# Patient Record
Sex: Male | Born: 1955 | Race: White | Hispanic: No | Marital: Married | Smoking: Never smoker
Health system: Southern US, Community
[De-identification: ages and names within clinical notes are randomized; demographics above are authoritative.]

## PROBLEM LIST (undated history)

## (undated) DIAGNOSIS — M7918 Myalgia, other site: Secondary | ICD-10-CM

## (undated) DIAGNOSIS — M549 Dorsalgia, unspecified: Secondary | ICD-10-CM

## (undated) DIAGNOSIS — M79603 Pain in arm, unspecified: Secondary | ICD-10-CM

## (undated) DIAGNOSIS — M1990 Unspecified osteoarthritis, unspecified site: Secondary | ICD-10-CM

## (undated) DIAGNOSIS — M5126 Other intervertebral disc displacement, lumbar region: Secondary | ICD-10-CM

## (undated) HISTORY — PX: KNEE ARTHROSCOPY: SUR90

## (undated) HISTORY — PX: WISDOM TOOTH EXTRACTION: SHX21

## (undated) HISTORY — DX: Other intervertebral disc displacement, lumbar region: M51.26

## (undated) HISTORY — PX: COLONOSCOPY: SHX5424

## (undated) HISTORY — DX: Pain in arm, unspecified: M79.603

## (undated) HISTORY — PX: OTHER SURGICAL HISTORY: SHX169

## (undated) HISTORY — DX: Myalgia, other site: M79.18

## (undated) HISTORY — DX: Dorsalgia, unspecified: M54.9

## (undated) HISTORY — PX: STRABISMUS SURGERY: SHX218

---

## 2018-01-26 ENCOUNTER — Other Ambulatory Visit: Payer: Self-pay

## 2018-01-26 ENCOUNTER — Encounter (HOSPITAL_COMMUNITY): Payer: Self-pay | Admitting: Emergency Medicine

## 2018-01-26 ENCOUNTER — Ambulatory Visit (HOSPITAL_COMMUNITY)
Admission: EM | Admit: 2018-01-26 | Discharge: 2018-01-26 | Disposition: A | Discharge status: Home / Self Care | Payer: Self-pay | Attending: Family Medicine | Admitting: Family Medicine

## 2018-01-26 DIAGNOSIS — L500 Allergic urticaria: Secondary | ICD-10-CM

## 2018-01-26 MED ORDER — METHYLPREDNISOLONE ACETATE 80 MG/ML IJ SUSP
80.0000 mg | Freq: Once | INTRAMUSCULAR | Status: AC
  Administered 2018-01-26: 80 mg via INTRAMUSCULAR

## 2018-01-26 MED ORDER — AMPHETAMINE-DEXTROAMPHETAMINE 20 MG PO TABS: 20.0000 mg | ORAL_TABLET | Freq: Every day | ORAL | 0 refills | Status: AC

## 2018-01-26 MED ORDER — METHYLPREDNISOLONE ACETATE 80 MG/ML IJ SUSP
INTRAMUSCULAR | Status: AC
  Filled 2018-01-26: qty 1

## 2018-01-26 NOTE — Discharge Instructions (Signed)
Take benadryl 25 mg at night Take double dose allegra or zyrtec for a few days Return as needed

## 2018-01-26 NOTE — ED Provider Notes (Signed)
[facility]    CSN: [id number] Arrival date & time: 01/26/18  4098     History   Chief Complaint Chief Complaint  Patient presents with  . Urticaria    HPI Derrick Zhang is a 62 y.o. male.   HPI  Patient did some yard work yesterday he came inside and felt itchy.  He looked down and he had rash on arms legs and his chest.  It itches significantly.  He took some Benadryl.  This made it feel better.  He is here today because the rash persists.  It seems like it was reduced yesterday evening but this morning was flared up again.  He has wheals on his thighs that are "as big as a ddessert plate." No chest pain or shortness of breath.  No underlying illness.  Not on any current medications, or supplements.  He is never had this reaction before. He does take Adderall for ADD.  He is been on this for a long time.  He is almost out because they just moved to the area and he has not found a new PCP yet.  He only has about 4 pills left.  I told him he really needs to work on getting a PCP so he can stay on this medicine, if he feels he needs it. No new foods.  No new soap lotion powder or product.  He states he was exposed to plants and insects while outside.  He has multiple mosquito bites.  History reviewed. No pertinent past medical history.  There are no active problems to display for this patient.   History reviewed. No pertinent surgical history.     Home Medications    Prior to Admission medications   Medication Sig Start Date End Date Taking? Authorizing Provider  amphetamine-dextroamphetamine (ADDERALL) 20 MG tablet Take 1 tablet (20 mg total) by mouth daily. 01/26/18   Raylene Everts, MD    Family History Family History  Problem Relation Age of Onset  . Heart disease Mother   . Heart disease Father     Social History Social History   Tobacco Use  . Smoking status: Never Smoker  Substance Use Topics  . Alcohol use: Yes  . Drug use: Never      Allergies   Penicillins   Review of Systems Review of Systems  Constitutional: Negative for chills and fever.  HENT: Negative for ear pain and sore throat.   Eyes: Negative for pain and visual disturbance.  Respiratory: Negative for cough and shortness of breath.   Cardiovascular: Negative for chest pain and palpitations.  Gastrointestinal: Negative for abdominal pain and vomiting.  Genitourinary: Negative for dysuria and hematuria.  Musculoskeletal: Negative for arthralgias and back pain.  Skin: Positive for rash. Negative for color change.  Neurological: Negative for seizures and syncope.  All other systems reviewed and are negative.    Physical Exam Triage Vital Signs ED Triage Vitals  Enc Vitals Group     BP 01/26/18 0858 107/72     Pulse Rate 01/26/18 0858 70     Resp 01/26/18 0858 16     Temp 01/26/18 0858 97.9 F (36.6 C)     Temp Source 01/26/18 0858 Oral     SpO2 01/26/18 0858 98 %     Weight --      Height --      Head Circumference --      Peak Flow --      Pain Score 01/26/18 0902 0  Pain Loc --      Pain Edu? --      Excl. in [place]? --    No data found.  Updated Vital Signs BP 107/72 (BP Location: Left Arm)   Pulse 70   Temp 97.9 F (36.6 C) (Oral)   Resp 16   SpO2 98%   Visual Acuity Right Eye Distance:   Left Eye Distance:   Bilateral Distance:    Right Eye Near:   Left Eye Near:    Bilateral Near:     Physical Exam  Constitutional: He appears well-developed and well-nourished. No distress.  HENT:  Head: Normocephalic and atraumatic.  Mouth/Throat: Oropharynx is clear and moist.  Eyes: Pupils are equal, round, and reactive to light. Conjunctivae are normal.  Neck: Normal range of motion.  Cardiovascular: Normal rate, regular rhythm and normal heart sounds.  Pulmonary/Chest: Effort normal and breath sounds normal. No respiratory distress. He has no wheezes.  Abdominal: Soft. He exhibits no distension.  Musculoskeletal: Normal  range of motion. He exhibits no edema.  Neurological: He is alert.  Skin: Skin is warm and dry.  Patient has multiple large urticarial wheals, some of them 15 to 20 cm across.  They are raised.  They are pruritic.  None are scratched open.  They are more proximal than distal, on the lower forearms and lower legs he has smaller 1 cm lesions that appear to be more insect bites.  ENT examination is normal.  Lungs are clear.  Psychiatric: He has a normal mood and affect. His behavior is normal.     UC Treatments / Results  Labs (all labs ordered are listed, but only abnormal results are displayed) Labs Reviewed - No data to display  EKG None  Radiology No results found.  Procedures Procedures (including critical care time)  Medications Ordered in UC Medications  methylPREDNISolone acetate (DEPO-MEDROL) injection 80 mg (80 mg Intramuscular Given 01/26/18 0925)    Initial Impression / Assessment and Plan / UC Course  I have reviewed the triage vital signs and the nursing notes.  Pertinent labs & imaging results that were available during my care of the patient were reviewed by me and considered in my medical decision making (see chart for details).     Discussed urticaria.  Discussed difficulty in establishing the exact cause.  He can go for allergy testing if this recurs.  For now we will treat with steroids, and antihistamines. Return if worse.  PCP referral Final Clinical Impressions(s) / UC Diagnoses   Final diagnoses:  Allergic urticaria     Discharge Instructions     Take benadryl 25 mg at night Take double dose allegra or zyrtec for a few days Return as needed   ED Prescriptions    Medication Sig Dispense Auth. Provider   amphetamine-dextroamphetamine (ADDERALL) 20 MG tablet Take 1 tablet (20 mg total) by mouth daily. 30 tablet Raylene Everts, MD     Controlled Substance Prescriptions [place] Controlled Substance Registry consulted? Not Applicable   Raylene Everts, MD 01/26/18 1214

## 2018-01-26 NOTE — ED Triage Notes (Signed)
Patient noticed hives yesterday morning after getting out of shower.  Patient has generalized itching.  Denies breathing issues or throat soreness

## 2019-01-01 ENCOUNTER — Other Ambulatory Visit: Payer: Self-pay | Admitting: Neurological Surgery

## 2019-01-01 ENCOUNTER — Other Ambulatory Visit: Payer: Self-pay | Admitting: *Deleted

## 2019-01-03 ENCOUNTER — Encounter: Payer: Self-pay | Admitting: Vascular Surgery

## 2019-01-23 ENCOUNTER — Encounter: Payer: Self-pay | Admitting: Vascular Surgery

## 2019-01-23 ENCOUNTER — Other Ambulatory Visit: Payer: Self-pay

## 2019-01-23 ENCOUNTER — Ambulatory Visit (INDEPENDENT_AMBULATORY_CARE_PROVIDER_SITE_OTHER): Payer: Self-pay | Admitting: Vascular Surgery

## 2019-01-23 VITALS — BP 123/76 | HR 82 | Resp 18 | Ht 75.0 in | Wt 185.0 lb

## 2019-01-23 DIAGNOSIS — M5136 Other intervertebral disc degeneration, lumbar region: Secondary | ICD-10-CM

## 2019-01-23 DIAGNOSIS — M51369 Other intervertebral disc degeneration, lumbar region without mention of lumbar back pain or lower extremity pain: Secondary | ICD-10-CM

## 2019-01-23 NOTE — Progress Notes (Signed)
[facility]ist of [facility]  Patient name: Derrick Zhang MRN: [id number] DOB: 1956-02-20 Sex: male  REASON FOR CONSULT: Discuss anterior exposure for L4-5 disc surgery with Dr. Ellene Route  HPI: Derrick Zhang is a 63 y.o. male, who is here today for discussion of anterior exposure.  He is scheduled for interbody fusion.  He has pain in his low back but more pain in his left buttock thigh and extending down into his leg from nerve impingement.  He has failed conservative therapy.  This is been progressive over the past several years and is become intolerable.  He has been seen by Dr. Ellene Route who is recommended anterior and posterior fusion.  He has had no prior intra-abdominal surgery.  Past Medical History:  Diagnosis Date  . Arm and leg pain   . Back pain   . Lumbar herniated disc    L4-L5 Level  . Pain in left buttock     Family History  Problem Relation Age of Onset  . Heart disease Mother   . Heart disease Father     SOCIAL HISTORY: Social History   Socioeconomic History  . Marital status: Married    Spouse name: Not on file  . Number of children: Not on file  . Years of education: Not on file  . Highest education level: Not on file  Occupational History  . Not on file  Social Needs  . Financial resource strain: Not on file  . Food insecurity    Worry: Not on file    Inability: Not on file  . Transportation needs    Medical: Not on file    Non-medical: Not on file  Tobacco Use  . Smoking status: Never Smoker  . Smokeless tobacco: Never Used  Substance and Sexual Activity  . Alcohol use: Yes  . Drug use: Never  . Sexual activity: Not on file  Lifestyle  . Physical activity    Days per week: Not on file    Minutes per session: Not on file  . Stress: Not on file  Relationships  . Social Herbalist on phone: Not on file    Gets together: Not on file    Attends religious service: Not on file    Active  member of club or organization: Not on file    Attends meetings of clubs or organizations: Not on file    Relationship status: Not on file  . Intimate partner violence    Fear of current or ex partner: Not on file    Emotionally abused: Not on file    Physically abused: Not on file    Forced sexual activity: Not on file  Other Topics Concern  . Not on file  Social History Narrative  . Not on file    Allergies  Allergen Reactions  . Penicillins     Current Outpatient Medications  Medication Sig Dispense Refill  . amphetamine-dextroamphetamine (ADDERALL) 20 MG tablet Take 1 tablet (20 mg total) by mouth daily. 30 tablet 0   No current facility-administered medications for this visit.     REVIEW OF SYSTEMS:  [X]  denotes positive finding, [ ]  denotes negative finding Cardiac  Comments:  Chest pain or chest pressure:    Shortness of breath upon exertion:    Short of breath when lying flat:    Irregular heart rhythm:        Vascular    Pain in calf, thigh, or hip brought on by ambulation:  x   Pain in feet at night that wakes you up from your sleep:     Blood clot in your veins:    Leg swelling:         Pulmonary    Oxygen at home:    Productive cough:     Wheezing:         Neurologic    Sudden weakness in arms or legs:     Sudden numbness in arms or legs:     Sudden onset of difficulty speaking or slurred speech:    Temporary loss of vision in one eye:     Problems with dizziness:         Gastrointestinal    Blood in stool:     Vomited blood:         Genitourinary    Burning when urinating:     Blood in urine:        Psychiatric    Major depression:         Hematologic    Bleeding problems:    Problems with blood clotting too easily:        Skin    Rashes or ulcers:        Constitutional    Fever or chills:      PHYSICAL EXAM: Vitals:   01/23/19 1303  BP: 123/76  Pulse: 82  Resp: 18  SpO2: 96%  Weight: 83.9 kg  Height: 6\' 3"  (1.905 m)     GENERAL: The patient is a well-nourished male, in no acute distress. The vital signs are documented above. CARDIOVASCULAR: Carotid arteries without bruits bilaterally.  2+ radial and 2+ dorsalis pedis pulses bilaterally PULMONARY: There is good air exchange  ABDOMEN: Soft and non-tender  MUSCULOSKELETAL: There are no major deformities or cyanosis. NEUROLOGIC: No focal weakness or paresthesias are detected. SKIN: There are no ulcers or rashes noted. PSYCHIATRIC: The patient has a normal affect.  DATA:  I have plain films for evaluation from Dr. Clarice Pole office on 10/20/2018.  This shows no significant calcification in his aorta iliac segments  MEDICAL ISSUES: Had long discussion with patient regarding my role in anterior exposure.  Explained to Dr. Ellene Route feels that he would be benefit from fusion and part of this from the anterior approach.  I explained mobilization of the rectus muscle, retroperitoneal exposure, mobilization of the left ureter and arterial and venous structures overlying the spine.  Discussed potential injury to all these in particular venous injury.  He understands and wished to proceed as scheduled   Rosetta Posner, MD [facility] Tel [phone] Pager [phone]

## 2019-02-05 NOTE — Pre-Procedure Instructions (Addendum)
Dathan Banaszewski  02/05/2019     Your procedure is scheduled on Friday, September 25..  Report to [facility], Main Entrance or Entrance "A" at 5:30 AM             Call this number if you have problems the morning of surgery: [phone]  This is the number for the Pre- Surgical Desk.    Remember:  Do not eat or drink after midnight Thursday, September 24.   Take these medicines the morning of surgery with A SIP OF WATER : none   Special instructions:  [facility]- Preparing For Surgery  Before surgery, you can play an important role. Because skin is not sterile, your skin needs to be as free of germs as possible. You can reduce the number of germs on your skin by washing with CHG (chlorahexidine gluconate) Soap before surgery.  CHG is an antiseptic cleaner which kills germs and bonds with the skin to continue killing germs even after washing.    Oral Hygiene is also important to reduce your risk of infection.  Remember - BRUSH YOUR TEETH THE MORNING OF SURGERY WITH YOUR REGULAR TOOTHPASTE  Please do not use if you have an allergy to CHG or antibacterial soaps. If your skin becomes reddened/irritated stop using the CHG.  Do not shave (including legs and underarms) for at least 48 hours prior to first CHG shower. It is OK to shave your face.  Please follow these instructions carefully.   1. Shower the NIGHT BEFORE SURGERY and the MORNING OF SURGERY with CHG.   2. If you chose to wash your hair, wash your hair first as usual with your normal shampoo.  3. After you shampoo, wash your face and private area with the soap you use at home, then rinse your hair and body thoroughly to remove the shampoo and soap.  4. Use CHG as you would any other liquid soap. You can apply CHG directly to the skin and wash gently with a scrungie or a clean washcloth.   5. Apply the CHG Soap to your body ONLY FROM THE NECK DOWN.  Do not use on open wounds or open sores. Avoid contact with your eyes,  ears, mouth and genitals (private parts).  6. Wash thoroughly, paying special attention to the area where your surgery will be performed.  7. Thoroughly rinse your body with warm water from the neck down.  8. DO NOT shower/wash with your normal soap after using and rinsing off the CHG Soap.  9. Pat yourself dry with a CLEAN TOWEL.  10. Wear CLEAN PAJAMAS to bed the night before surgery, wear comfortable clothes the morning of surgery  11. Place CLEAN SHEETS on your bed the night of your first shower and DO NOT SLEEP WITH PETS.  Day of Surgery: Shower as instructed above Do not wear lotions, powders, or cologne, or deodorant. Please wear clean clothes to the hospital/surgery center.   Remember to brush your teeth WITH YOUR REGULAR TOOTHPASTE.  Do not wear jewelry, make-up or nail polish.             Men may shave face and neck.  Do not bring valuables to the hospital.  [facility] is not responsible for any belongings or valuables.  Contacts, dentures or bridgework may not be worn into surgery.  Leave your suitcase in the car.  After surgery it may be brought to your room.  For patients admitted to the hospital, discharge time  will be determined by your treatment team.  Patients discharged the day of surgery will not be allowed to drive home.   Please read over the following fact sheets that you were given: Pain Management, Coughing and Deep Breathing, Surgical Site Infections, Spine Surgery Patient Guide

## 2019-02-06 ENCOUNTER — Encounter (HOSPITAL_COMMUNITY): Payer: Self-pay

## 2019-02-06 ENCOUNTER — Other Ambulatory Visit: Payer: Self-pay

## 2019-02-06 ENCOUNTER — Encounter (HOSPITAL_COMMUNITY)
Admission: RE | Admit: 2019-02-06 | Discharge: 2019-02-06 | Disposition: A | Discharge status: Home / Self Care | Payer: Medicare Other | Source: Ambulatory Visit | Attending: Neurological Surgery | Admitting: Neurological Surgery

## 2019-02-06 ENCOUNTER — Other Ambulatory Visit (HOSPITAL_COMMUNITY)
Admission: RE | Admit: 2019-02-06 | Discharge: 2019-02-06 | Disposition: A | Discharge status: Home / Self Care | Payer: Medicare Other | Source: Ambulatory Visit | Attending: Neurological Surgery | Admitting: Neurological Surgery

## 2019-02-06 DIAGNOSIS — Z01812 Encounter for preprocedural laboratory examination: Secondary | ICD-10-CM | POA: Insufficient documentation

## 2019-02-06 HISTORY — DX: Unspecified osteoarthritis, unspecified site: M19.90

## 2019-02-06 LAB — TYPE AND SCREEN
ABO/RH(D): A POS
Antibody Screen: NEGATIVE

## 2019-02-06 LAB — CBC
HCT: 44.3 % (ref 39.0–52.0)
Hemoglobin: 14.3 g/dL (ref 13.0–17.0)
MCH: 30 pg (ref 26.0–34.0)
MCHC: 32.3 g/dL (ref 30.0–36.0)
MCV: 92.9 fL (ref 80.0–100.0)
Platelets: 206 10*3/uL (ref 150–400)
RBC: 4.77 MIL/uL (ref 4.22–5.81)
RDW: 13.7 % (ref 11.5–15.5)
WBC: 5.9 10*3/uL (ref 4.0–10.5)
nRBC: 0 % (ref 0.0–0.2)

## 2019-02-06 LAB — ABO/RH: ABO/RH(D): A POS

## 2019-02-06 LAB — SURGICAL PCR SCREEN
MRSA, PCR: NEGATIVE
Staphylococcus aureus: POSITIVE — AB

## 2019-02-06 NOTE — Progress Notes (Addendum)
PCP - Dr. Dagmar Hait  Cardiologist - no  Chest x-ray - na  EKG - na  Stress Test - no  ECHO - no  Cardiac Cath - no  Sleep Study - no CPAP - no  LABS-CBC, T/S  ASA-no  ERAS-no  HA1C-na Fasting Blood Sugar - na Checks Blood Sugar __0___ times a day  Anesthesia-  Pt denies having chest pain, sob, or fever at this time. All instructions explained to the pt, with a verbal understanding of the material. Pt agrees to go over the instructions while at home for a better understanding. Pt also instructed to self quarantine after being tested for COVID-19. The opportunity to ask questions was provided.

## 2019-02-07 LAB — NOVEL CORONAVIRUS, NAA (HOSP ORDER, SEND-OUT TO REF LAB; TAT 18-24 HRS): SARS-CoV-2, NAA: NOT DETECTED

## 2019-02-09 ENCOUNTER — Inpatient Hospital Stay (HOSPITAL_COMMUNITY): Payer: Medicare Other | Admitting: Anesthesiology

## 2019-02-09 ENCOUNTER — Other Ambulatory Visit: Payer: Self-pay

## 2019-02-09 ENCOUNTER — Inpatient Hospital Stay (HOSPITAL_COMMUNITY): Payer: Medicare Other

## 2019-02-09 ENCOUNTER — Inpatient Hospital Stay (HOSPITAL_COMMUNITY)
Admission: RE | Admit: 2019-02-09 | Discharge: 2019-02-10 | DRG: 460 | Disposition: A | Discharge status: Home / Self Care | Payer: Medicare Other | Attending: Neurological Surgery | Admitting: Neurological Surgery

## 2019-02-09 ENCOUNTER — Encounter (HOSPITAL_COMMUNITY): Payer: Self-pay | Admitting: *Deleted

## 2019-02-09 ENCOUNTER — Encounter (HOSPITAL_COMMUNITY)
Admission: RE | Disposition: A | Discharge status: Home / Self Care | Payer: Self-pay | Source: Home / Self Care | Attending: Neurological Surgery

## 2019-02-09 DIAGNOSIS — M1990 Unspecified osteoarthritis, unspecified site: Secondary | ICD-10-CM | POA: Diagnosis present

## 2019-02-09 DIAGNOSIS — Z880 Allergy status to penicillin: Secondary | ICD-10-CM | POA: Diagnosis not present

## 2019-02-09 DIAGNOSIS — Z20828 Contact with and (suspected) exposure to other viral communicable diseases: Secondary | ICD-10-CM | POA: Diagnosis present

## 2019-02-09 DIAGNOSIS — M5116 Intervertebral disc disorders with radiculopathy, lumbar region: Secondary | ICD-10-CM | POA: Diagnosis present

## 2019-02-09 DIAGNOSIS — M5416 Radiculopathy, lumbar region: Secondary | ICD-10-CM | POA: Diagnosis not present

## 2019-02-09 DIAGNOSIS — M5126 Other intervertebral disc displacement, lumbar region: Secondary | ICD-10-CM | POA: Diagnosis present

## 2019-02-09 DIAGNOSIS — Z419 Encounter for procedure for purposes other than remedying health state, unspecified: Secondary | ICD-10-CM

## 2019-02-09 HISTORY — PX: ABDOMINAL EXPOSURE: SHX5708

## 2019-02-09 HISTORY — PX: ANTERIOR LUMBAR FUSION: SHX1170

## 2019-02-09 SURGERY — ANTERIOR LUMBAR FUSION 1 LEVEL
Anesthesia: General

## 2019-02-09 MED ORDER — LIDOCAINE 2% (20 MG/ML) 5 ML SYRINGE
INTRAMUSCULAR | Status: AC
  Filled 2019-02-09: qty 5

## 2019-02-09 MED ORDER — FENTANYL CITRATE (PF) 250 MCG/5ML IJ SOLN
INTRAMUSCULAR | Status: AC
  Filled 2019-02-09: qty 5

## 2019-02-09 MED ORDER — DEXAMETHASONE SODIUM PHOSPHATE 10 MG/ML IJ SOLN
INTRAMUSCULAR | Status: AC
  Filled 2019-02-09: qty 1

## 2019-02-09 MED ORDER — CHLORHEXIDINE GLUCONATE CLOTH 2 % EX PADS: 6.0000 | MEDICATED_PAD | Freq: Once | CUTANEOUS | Status: DC

## 2019-02-09 MED ORDER — FENTANYL CITRATE (PF) 100 MCG/2ML IJ SOLN
INTRAMUSCULAR | Status: DC | PRN
  Administered 2019-02-09 (×2): 50 ug via INTRAVENOUS
  Administered 2019-02-09: 100 ug via INTRAVENOUS
  Administered 2019-02-09: 50 ug via INTRAVENOUS

## 2019-02-09 MED ORDER — VANCOMYCIN HCL IN DEXTROSE 1-5 GM/200ML-% IV SOLN
INTRAVENOUS | Status: AC
  Administered 2019-02-09: 06:00:00 1000 mg via INTRAVENOUS
  Filled 2019-02-09: qty 200

## 2019-02-09 MED ORDER — MORPHINE SULFATE (PF) 2 MG/ML IV SOLN: 2.0000 mg | INTRAVENOUS | Status: DC | PRN

## 2019-02-09 MED ORDER — ONDANSETRON HCL 4 MG/2ML IJ SOLN
INTRAMUSCULAR | Status: DC | PRN
  Administered 2019-02-09: 4 mg via INTRAVENOUS

## 2019-02-09 MED ORDER — SUGAMMADEX SODIUM 200 MG/2ML IV SOLN
INTRAVENOUS | Status: DC | PRN
  Administered 2019-02-09: 175 mg via INTRAVENOUS

## 2019-02-09 MED ORDER — VANCOMYCIN HCL IN DEXTROSE 1-5 GM/200ML-% IV SOLN
1000.0000 mg | Freq: Once | INTRAVENOUS | Status: AC
  Administered 2019-02-09: 1000 mg via INTRAVENOUS
  Filled 2019-02-09: qty 200

## 2019-02-09 MED ORDER — ONDANSETRON HCL 4 MG/2ML IJ SOLN
INTRAMUSCULAR | Status: AC
  Filled 2019-02-09: qty 2

## 2019-02-09 MED ORDER — CEFAZOLIN SODIUM-DEXTROSE 2-4 GM/100ML-% IV SOLN: 2.0000 g | Freq: Three times a day (TID) | INTRAVENOUS | Status: DC

## 2019-02-09 MED ORDER — PROPOFOL 10 MG/ML IV BOLUS
INTRAVENOUS | Status: DC | PRN
  Administered 2019-02-09: 150 mg via INTRAVENOUS

## 2019-02-09 MED ORDER — PROPOFOL 10 MG/ML IV BOLUS
INTRAVENOUS | Status: AC
  Filled 2019-02-09: qty 40

## 2019-02-09 MED ORDER — CHLORHEXIDINE GLUCONATE 4 % EX LIQD: 60.0000 mL | Freq: Once | CUTANEOUS | Status: DC

## 2019-02-09 MED ORDER — ACETAMINOPHEN 10 MG/ML IV SOLN
1000.0000 mg | Freq: Once | INTRAVENOUS | Status: AC
  Administered 2019-02-09: 1000 mg via INTRAVENOUS

## 2019-02-09 MED ORDER — SENNA 8.6 MG PO TABS
1.0000 | ORAL_TABLET | Freq: Two times a day (BID) | ORAL | Status: DC
  Administered 2019-02-09 – 2019-02-10 (×2): 8.6 mg via ORAL
  Filled 2019-02-09 (×2): qty 1

## 2019-02-09 MED ORDER — LACTATED RINGERS IV SOLN
INTRAVENOUS | Status: DC | PRN
  Administered 2019-02-09: 08:00:00 via INTRAVENOUS

## 2019-02-09 MED ORDER — MENTHOL 3 MG MT LOZG: 1.0000 | LOZENGE | OROMUCOSAL | Status: DC | PRN

## 2019-02-09 MED ORDER — ACETAMINOPHEN 10 MG/ML IV SOLN
INTRAVENOUS | Status: AC
  Filled 2019-02-09: qty 100

## 2019-02-09 MED ORDER — EPHEDRINE SULFATE-NACL 50-0.9 MG/10ML-% IV SOSY
PREFILLED_SYRINGE | INTRAVENOUS | Status: DC | PRN
  Administered 2019-02-09: 5 mg via INTRAVENOUS

## 2019-02-09 MED ORDER — BISACODYL 10 MG RE SUPP: 10.0000 mg | Freq: Every day | RECTAL | Status: DC | PRN

## 2019-02-09 MED ORDER — DOCUSATE SODIUM 100 MG PO CAPS
100.0000 mg | ORAL_CAPSULE | Freq: Two times a day (BID) | ORAL | Status: DC
  Administered 2019-02-09 – 2019-02-10 (×2): 100 mg via ORAL
  Filled 2019-02-09 (×2): qty 1

## 2019-02-09 MED ORDER — LIDOCAINE 2% (20 MG/ML) 5 ML SYRINGE
INTRAMUSCULAR | Status: DC | PRN
  Administered 2019-02-09: 100 mg via INTRAVENOUS

## 2019-02-09 MED ORDER — BUPIVACAINE HCL (PF) 0.5 % IJ SOLN
INTRAMUSCULAR | Status: AC
  Filled 2019-02-09: qty 30

## 2019-02-09 MED ORDER — FENTANYL CITRATE (PF) 100 MCG/2ML IJ SOLN
INTRAMUSCULAR | Status: AC
  Administered 2019-02-09: 25 ug via INTRAVENOUS
  Filled 2019-02-09: qty 2

## 2019-02-09 MED ORDER — AMPHETAMINE-DEXTROAMPHETAMINE 10 MG PO TABS
20.0000 mg | ORAL_TABLET | Freq: Every day | ORAL | Status: DC
  Administered 2019-02-10: 20 mg via ORAL
  Filled 2019-02-09: qty 2

## 2019-02-09 MED ORDER — OXYCODONE HCL 5 MG PO TABS
ORAL_TABLET | ORAL | Status: AC
  Administered 2019-02-09: 5 mg
  Filled 2019-02-09: qty 1

## 2019-02-09 MED ORDER — POLYETHYLENE GLYCOL 3350 17 G PO PACK: 17.0000 g | PACK | Freq: Every day | ORAL | Status: DC | PRN

## 2019-02-09 MED ORDER — ALUM & MAG HYDROXIDE-SIMETH 200-200-20 MG/5ML PO SUSP: 30.0000 mL | Freq: Four times a day (QID) | ORAL | Status: DC | PRN

## 2019-02-09 MED ORDER — KETOROLAC TROMETHAMINE 15 MG/ML IJ SOLN
7.5000 mg | Freq: Four times a day (QID) | INTRAMUSCULAR | Status: AC
  Administered 2019-02-09 – 2019-02-10 (×4): 7.5 mg via INTRAVENOUS
  Filled 2019-02-09 (×3): qty 1

## 2019-02-09 MED ORDER — ACETAMINOPHEN 500 MG PO TABS: 500.0000 mg | ORAL_TABLET | Freq: Four times a day (QID) | ORAL | Status: DC | PRN

## 2019-02-09 MED ORDER — ONDANSETRON HCL 4 MG/2ML IJ SOLN: 4.0000 mg | Freq: Four times a day (QID) | INTRAMUSCULAR | Status: DC | PRN

## 2019-02-09 MED ORDER — ROCURONIUM BROMIDE 10 MG/ML (PF) SYRINGE
PREFILLED_SYRINGE | INTRAVENOUS | Status: DC | PRN
  Administered 2019-02-09 (×3): 20 mg via INTRAVENOUS
  Administered 2019-02-09: 60 mg via INTRAVENOUS

## 2019-02-09 MED ORDER — METHOCARBAMOL 500 MG PO TABS
500.0000 mg | ORAL_TABLET | Freq: Four times a day (QID) | ORAL | Status: DC | PRN
  Administered 2019-02-09: 11:00:00 500 mg via ORAL
  Filled 2019-02-09: qty 1

## 2019-02-09 MED ORDER — PHENOL 1.4 % MT LIQD: 1.0000 | OROMUCOSAL | Status: DC | PRN

## 2019-02-09 MED ORDER — LIDOCAINE-EPINEPHRINE 1 %-1:100000 IJ SOLN
INTRAMUSCULAR | Status: DC | PRN
  Administered 2019-02-09: 3 mL

## 2019-02-09 MED ORDER — VANCOMYCIN HCL IN DEXTROSE 1-5 GM/200ML-% IV SOLN
1000.0000 mg | INTRAVENOUS | Status: AC
  Administered 2019-02-09: 06:00:00 1000 mg via INTRAVENOUS

## 2019-02-09 MED ORDER — ACETAMINOPHEN 650 MG RE SUPP: 650.0000 mg | RECTAL | Status: DC | PRN

## 2019-02-09 MED ORDER — FLEET ENEMA 7-19 GM/118ML RE ENEM: 1.0000 | ENEMA | Freq: Once | RECTAL | Status: DC | PRN

## 2019-02-09 MED ORDER — MIDAZOLAM HCL 2 MG/2ML IJ SOLN
INTRAMUSCULAR | Status: AC
  Filled 2019-02-09: qty 2

## 2019-02-09 MED ORDER — BUPIVACAINE HCL (PF) 0.5 % IJ SOLN
INTRAMUSCULAR | Status: DC | PRN
  Administered 2019-02-09: 3 mL

## 2019-02-09 MED ORDER — THROMBIN 5000 UNITS EX SOLR
CUTANEOUS | Status: AC
  Filled 2019-02-09: qty 5000

## 2019-02-09 MED ORDER — ROCURONIUM BROMIDE 10 MG/ML (PF) SYRINGE
PREFILLED_SYRINGE | INTRAVENOUS | Status: AC
  Filled 2019-02-09: qty 20

## 2019-02-09 MED ORDER — METHOCARBAMOL 500 MG PO TABS
ORAL_TABLET | ORAL | Status: AC
  Administered 2019-02-09: 500 mg via ORAL
  Filled 2019-02-09: qty 1

## 2019-02-09 MED ORDER — FENTANYL CITRATE (PF) 100 MCG/2ML IJ SOLN
25.0000 ug | INTRAMUSCULAR | Status: DC | PRN
  Administered 2019-02-09: 12:00:00 25 ug via INTRAVENOUS

## 2019-02-09 MED ORDER — KETOROLAC TROMETHAMINE 15 MG/ML IJ SOLN
INTRAMUSCULAR | Status: AC
  Administered 2019-02-09: 11:00:00 7.5 mg via INTRAVENOUS
  Filled 2019-02-09: qty 1

## 2019-02-09 MED ORDER — LACTATED RINGERS IV SOLN: INTRAVENOUS | Status: DC

## 2019-02-09 MED ORDER — MIDAZOLAM HCL 5 MG/5ML IJ SOLN
INTRAMUSCULAR | Status: DC | PRN
  Administered 2019-02-09: 2 mg via INTRAVENOUS

## 2019-02-09 MED ORDER — EPHEDRINE 5 MG/ML INJ
INTRAVENOUS | Status: AC
  Filled 2019-02-09: qty 10

## 2019-02-09 MED ORDER — LACTATED RINGERS IV SOLN
INTRAVENOUS | Status: DC | PRN
  Administered 2019-02-09 (×2): via INTRAVENOUS

## 2019-02-09 MED ORDER — LIDOCAINE-EPINEPHRINE 1 %-1:100000 IJ SOLN
INTRAMUSCULAR | Status: AC
  Filled 2019-02-09: qty 1

## 2019-02-09 MED ORDER — SODIUM CHLORIDE 0.9% FLUSH: 3.0000 mL | INTRAVENOUS | Status: DC | PRN

## 2019-02-09 MED ORDER — DEXAMETHASONE SODIUM PHOSPHATE 10 MG/ML IJ SOLN
INTRAMUSCULAR | Status: DC | PRN
  Administered 2019-02-09: 10 mg via INTRAVENOUS

## 2019-02-09 MED ORDER — 0.9 % SODIUM CHLORIDE (POUR BTL) OPTIME
TOPICAL | Status: DC | PRN
  Administered 2019-02-09: 09:00:00 1000 mL

## 2019-02-09 MED ORDER — OXYCODONE-ACETAMINOPHEN 5-325 MG PO TABS: 1.0000 | ORAL_TABLET | ORAL | Status: DC | PRN

## 2019-02-09 MED ORDER — THROMBIN 20000 UNITS EX SOLR
CUTANEOUS | Status: AC
  Filled 2019-02-09: qty 20000

## 2019-02-09 MED ORDER — METHOCARBAMOL 1000 MG/10ML IJ SOLN
500.0000 mg | Freq: Four times a day (QID) | INTRAVENOUS | Status: DC | PRN
  Filled 2019-02-09: qty 5

## 2019-02-09 MED ORDER — THROMBIN 5000 UNITS EX SOLR
OROMUCOSAL | Status: DC | PRN
  Administered 2019-02-09: 09:00:00 5 mL via TOPICAL

## 2019-02-09 MED ORDER — ACETAMINOPHEN 325 MG PO TABS: 650.0000 mg | ORAL_TABLET | ORAL | Status: DC | PRN

## 2019-02-09 MED ORDER — SODIUM CHLORIDE 0.9 % IV SOLN
INTRAVENOUS | Status: DC | PRN
  Administered 2019-02-09: 500 mL

## 2019-02-09 MED ORDER — ONDANSETRON HCL 4 MG PO TABS: 4.0000 mg | ORAL_TABLET | Freq: Four times a day (QID) | ORAL | Status: DC | PRN

## 2019-02-09 MED ORDER — SODIUM CHLORIDE 0.9% FLUSH
3.0000 mL | Freq: Two times a day (BID) | INTRAVENOUS | Status: DC
  Administered 2019-02-09: 10 mL via INTRAVENOUS

## 2019-02-09 MED ORDER — PROMETHAZINE HCL 25 MG/ML IJ SOLN: 6.2500 mg | INTRAMUSCULAR | Status: DC | PRN

## 2019-02-09 SURGICAL SUPPLY — 83 items
APPLIER CLIP 11 MED OPEN (CLIP) ×3
BAG DECANTER FOR FLEXI CONT (MISCELLANEOUS) ×3 IMPLANT
BASKET BONE COLLECTION (BASKET) IMPLANT
BONE CANC CHIPS 20CC PCAN1/4 (Bone Implant) ×3 IMPLANT
BUR BARREL STRAIGHT FLUTE 4.0 (BURR) IMPLANT
BUR MATCHSTICK NEURO 3.0 LAGG (BURR) ×2 IMPLANT
CANISTER SUCT 3000ML PPV (MISCELLANEOUS) ×3 IMPLANT
CHIPS CANC BONE 20CC PCAN1/4 (Bone Implant) ×1 IMPLANT
CLIP APPLIE 11 MED OPEN (CLIP) ×2 IMPLANT
CLIP LIGATING EXTRA MED SLVR (CLIP) ×3 IMPLANT
CLIP LIGATING EXTRA SM BLUE (MISCELLANEOUS) ×1 IMPLANT
COVER BACK TABLE 60X90IN (DRAPES) ×3 IMPLANT
COVER WAND RF STERILE (DRAPES) ×6 IMPLANT
DECANTER SPIKE VIAL GLASS SM (MISCELLANEOUS) ×1 IMPLANT
DERMABOND ADVANCED (GAUZE/BANDAGES/DRESSINGS) ×2
DERMABOND ADVANCED .7 DNX12 (GAUZE/BANDAGES/DRESSINGS) ×2 IMPLANT
DRAPE C-ARM 42X72 X-RAY (DRAPES) ×3 IMPLANT
DRAPE C-ARMOR (DRAPES) ×3 IMPLANT
DRAPE LAPAROTOMY 100X72X124 (DRAPES) ×3 IMPLANT
DURAPREP 26ML APPLICATOR (WOUND CARE) ×3 IMPLANT
ELECT BLADE 4.0 EZ CLEAN MEGAD (MISCELLANEOUS) ×3
ELECT REM PT RETURN 9FT ADLT (ELECTROSURGICAL) ×3
ELECTRODE BLDE 4.0 EZ CLN MEGD (MISCELLANEOUS) ×2 IMPLANT
ELECTRODE REM PT RTRN 9FT ADLT (ELECTROSURGICAL) ×1 IMPLANT
GAUZE 4X4 16PLY RFD (DISPOSABLE) IMPLANT
GLOVE BIOGEL PI IND STRL 7.5 (GLOVE) IMPLANT
GLOVE BIOGEL PI IND STRL 8.5 (GLOVE) IMPLANT
GLOVE BIOGEL PI INDICATOR 7.5 (GLOVE)
GLOVE BIOGEL PI INDICATOR 8.5 (GLOVE)
GLOVE ECLIPSE 8.5 STRL (GLOVE) ×3 IMPLANT
GLOVE SS BIOGEL STRL SZ 7.5 (GLOVE) ×1 IMPLANT
GLOVE SUPERSENSE BIOGEL SZ 7.5 (GLOVE) ×2
GOWN STRL REUS W/ TWL LRG LVL3 (GOWN DISPOSABLE) ×1 IMPLANT
GOWN STRL REUS W/ TWL XL LVL3 (GOWN DISPOSABLE) ×1 IMPLANT
GOWN STRL REUS W/TWL 2XL LVL3 (GOWN DISPOSABLE) ×3 IMPLANT
GOWN STRL REUS W/TWL LRG LVL3 (GOWN DISPOSABLE) ×2
GOWN STRL REUS W/TWL XL LVL3 (GOWN DISPOSABLE) ×2
GRAFT BNE CANC CHIPS 1-8 20CC (Bone Implant) IMPLANT
HEMOSTAT POWDER KIT SURGIFOAM (HEMOSTASIS) IMPLANT
INSERT FOGARTY 61MM (MISCELLANEOUS) IMPLANT
INSERT FOGARTY SM (MISCELLANEOUS) IMPLANT
KIT BASIN OR (CUSTOM PROCEDURE TRAY) ×3 IMPLANT
KIT TURNOVER KIT B (KITS) ×3 IMPLANT
LOOP VESSEL MAXI BLUE (MISCELLANEOUS) IMPLANT
LOOP VESSEL MINI RED (MISCELLANEOUS) IMPLANT
NDL HYPO 25X1 1.5 SAFETY (NEEDLE) IMPLANT
NDL SPNL 18GX3.5 QUINCKE PK (NEEDLE) IMPLANT
NEEDLE HYPO 25X1 1.5 SAFETY (NEEDLE) IMPLANT
NEEDLE SPNL 18GX3.5 QUINCKE PK (NEEDLE) IMPLANT
NS IRRIG 1000ML POUR BTL (IV SOLUTION) ×3 IMPLANT
PACK LAMINECTOMY NEURO (CUSTOM PROCEDURE TRAY) ×3 IMPLANT
PAD ARMBOARD 7.5X6 YLW CONV (MISCELLANEOUS) ×6 IMPLANT
PUTTY KINEX BIOACTIVE 5CC (Bone Implant) ×2 IMPLANT
SCREW VA LUM IND 5.5X25 (Screw) ×6 IMPLANT
SPACER HEDRON IA 29X39X15 8D (Spacer) ×2 IMPLANT
SPONGE INTESTINAL PEANUT (DISPOSABLE) ×9 IMPLANT
SPONGE LAP 18X18 RF (DISPOSABLE) ×5 IMPLANT
SPONGE LAP 4X18 RFD (DISPOSABLE) IMPLANT
SPONGE SURGIFOAM ABS GEL 100 (HEMOSTASIS) ×3 IMPLANT
STAPLER VISISTAT 35W (STAPLE) IMPLANT
SUT PDS AB 1 CTX 36 (SUTURE) ×2 IMPLANT
SUT PROLENE 4 0 RB 1 (SUTURE)
SUT PROLENE 4-0 RB1 .5 CRCL 36 (SUTURE) IMPLANT
SUT PROLENE 5 0 CC1 (SUTURE) IMPLANT
SUT PROLENE 6 0 C 1 30 (SUTURE) ×1 IMPLANT
SUT PROLENE 6 0 CC (SUTURE) IMPLANT
SUT SILK 0 TIES 10X30 (SUTURE) ×1 IMPLANT
SUT SILK 2 0 TIES 10X30 (SUTURE) ×4 IMPLANT
SUT SILK 2 0SH CR/8 30 (SUTURE) IMPLANT
SUT SILK 3 0 TIES 10X30 (SUTURE) ×3 IMPLANT
SUT SILK 3 0SH CR/8 30 (SUTURE) IMPLANT
SUT VIC AB 0 CT1 27 (SUTURE) ×2
SUT VIC AB 0 CT1 27XBRD ANBCTR (SUTURE) ×2 IMPLANT
SUT VIC AB 1 CT1 18XBRD ANBCTR (SUTURE) ×1 IMPLANT
SUT VIC AB 1 CT1 8-18 (SUTURE)
SUT VIC AB 2-0 CP2 18 (SUTURE) ×5 IMPLANT
SUT VIC AB 3-0 SH 8-18 (SUTURE) ×5 IMPLANT
SUT VIC AB 4-0 RB1 18 (SUTURE) ×2 IMPLANT
SUT VICRYL 4-0 PS2 18IN ABS (SUTURE) IMPLANT
TOWEL GREEN STERILE (TOWEL DISPOSABLE) ×4 IMPLANT
TOWEL GREEN STERILE FF (TOWEL DISPOSABLE) ×9 IMPLANT
TRAY FOLEY MTR SLVR 16FR STAT (SET/KITS/TRAYS/PACK) ×3 IMPLANT
WATER STERILE IRR 1000ML POUR (IV SOLUTION) ×3 IMPLANT

## 2019-02-09 NOTE — Progress Notes (Signed)
Patient ID: Derrick Zhang, male   DOB: 1955/12/14, 63 y.o.   MRN: [id number] Awake and alert postop minimal discomfort.  Left leg feels well motor function is intact.  Incision is clean and dry.

## 2019-02-09 NOTE — Anesthesia Preprocedure Evaluation (Addendum)
Anesthesia Evaluation  Patient identified by MRN, date of birth, ID band Patient awake    Reviewed: Allergy & Precautions, NPO status , Patient's Chart, lab work & pertinent test results  Airway Mallampati: II  TM Distance: >3 FB Neck ROM: Full    Dental  (+) Teeth Intact, Dental Advisory Given, Caps,    Pulmonary neg pulmonary ROS,    Pulmonary exam normal breath sounds clear to auscultation       Cardiovascular Exercise Tolerance: Good negative cardio ROS Normal cardiovascular exam Rhythm:Regular Rate:Normal     Neuro/Psych Displacement of lumbosacral intervertebral disc negative psych ROS   GI/Hepatic negative GI ROS, Neg liver ROS,   Endo/Other  negative endocrine ROS  Renal/GU negative Renal ROS     Musculoskeletal  (+) Arthritis , Osteoarthritis,    Abdominal   Peds  Hematology negative hematology ROS (+)   Anesthesia Other Findings Day of surgery medications reviewed with the patient.  Reproductive/Obstetrics                            Anesthesia Physical Anesthesia Plan  ASA: II  Anesthesia Plan: General   Post-op Pain Management:    Induction: Intravenous  PONV Risk Score and Plan: 3 and Midazolam, Dexamethasone and Ondansetron  Airway Management Planned: Oral ETT  Additional Equipment:   Intra-op Plan:   Post-operative Plan: Extubation in OR  Informed Consent: I have reviewed the patients History and Physical, chart, labs and discussed the procedure including the risks, benefits and alternatives for the proposed anesthesia with the patient or authorized representative who has indicated his/her understanding and acceptance.     Dental advisory given  Plan Discussed with: CRNA  Anesthesia Plan Comments:         Anesthesia Quick Evaluation

## 2019-02-09 NOTE — Anesthesia Postprocedure Evaluation (Signed)
Anesthesia Post Note  Patient: [name]  Procedure(s) Performed: Lumbar Four-Five Anterior lumbar interbody fusion (N/A ) ABDOMINAL EXPOSURE (N/A )     Patient location during evaluation: PACU Anesthesia Type: General Level of consciousness: awake and alert Pain management: pain level controlled Vital Signs Assessment: post-procedure vital signs reviewed and stable Respiratory status: spontaneous breathing, nonlabored ventilation, respiratory function stable and patient connected to nasal cannula oxygen Cardiovascular status: blood pressure returned to baseline and stable Postop Assessment: no apparent nausea or vomiting Anesthetic complications: no    Last Vitals:  Vitals:   02/09/19 1140 02/09/19 1225  BP:  138/80  Pulse: 60 67  Resp: 12 18  Temp:  36.4 C  SpO2: 99%     Last Pain:  Vitals:   02/09/19 1225  TempSrc: Oral  PainSc:                  Catalina Gravel

## 2019-02-09 NOTE — Progress Notes (Signed)
Orthopedic Tech Progress Note Patient Details:  Derrick Zhang 12/19/1955 [id number]  Patient ID: [name], male   DOB: [birth year], 63 y.o.   MRN: [id number]   [place] Pink 02/09/2019, 1:14 PMCalled [employer] for Lumbar brace.

## 2019-02-09 NOTE — Transfer of Care (Signed)
Immediate Anesthesia Transfer of Care Note  Patient: Derrick Zhang  Procedure(s) Performed: Lumbar Four-Five Anterior lumbar interbody fusion (N/A ) ABDOMINAL EXPOSURE (N/A )  Patient Location: PACU  Anesthesia Type:General  Level of Consciousness: oriented, drowsy and patient cooperative  Airway & Oxygen Therapy: Patient Spontanous Breathing and Patient connected to face mask oxygen  Post-op Assessment: Report given to RN and Post -op Vital signs reviewed and stable  Post vital signs: Reviewed  Last Vitals:  Vitals Value Taken Time  BP 141/75 02/09/19 1045  Temp    Pulse 73 02/09/19 1046  Resp 20 02/09/19 1046  SpO2 100 % 02/09/19 1046  Vitals shown include unvalidated device data.  Last Pain:  Vitals:   02/09/19 0605  TempSrc: Oral  PainSc: 0-No pain      Patients Stated Pain Goal: 2 ([id number])  Complications: No apparent anesthesia complications

## 2019-02-09 NOTE — H&P (Signed)
CHIEF COMPLAINT: Buttock and left leg pain.  HISTORY OF PRESENT ILLNESS: Derrick Zhang is a 63 year old, right-handed individual, who tells me that sometime around the end of December he started to develop significant pain in his back and buttock on the left side.  He notes that the pain became progressively worse for a period of time and ultimately sought consultation with [facility].  His evaluation there ultimately included an MRI of the lumbar spine that was performed in January of 2020.  This study demonstrates the presence of a disc herniation, which is central and broad-based, but more eccentric to the left side causing some elevation and compression of the exiting L5 nerve root on that left side.  Since that time, Derrick Zhang has gone through conservative efforts, including at least 3 epidural steroid injections, a number of treatments with physical therapy, some chiropractic treatment, all which have alleviated the pain to some degree; however, he has not been pain free, and he notes that any degree of significant activity will tend to aggravate substantial pain for him in the buttock and down into the left leg and onto the dorsum of the foot and the lateral aspect of it.  He does not feel any weakness, per se, and he has been able to walk, albeit walking a distance will tend to aggravate pain for him also.  He notes that there are certain times when the pain is not very severe. Generally, in the mornings, he notes that he has to stretch and walk for a period of time before he can walk upright.  He tends to favor a slight forward stooped position, particularly if he has to sit for long periods of time, and he finds that harder chairs are better than softer chairs.  This process has been ongoing now, and he has been seen by Dr. Lynann Bologna for consideration of surgery.  He is seen now for an opinion regarding surgical intervention for this process.  The MRI demonstrates that the patient's  back otherwise appears fairly healthy, save for L5-S1, which he has had some advanced degenerative changes, but complete collapse of the disc space, without any evidence of significant foraminal stenosis or posterior bony ridging. L3-4 appears to be a healthy disc and L2-3 has some mild to moderate degenerative changes.  The L4-5 disc, however, is broadly degenerated, still has a fairly good height, but has a broad-based disc herniation posteriorly, with some annular protrusion off to the right side also noted.  PAST MEDICAL HISTORY: Reveals that his general health has been excellent.  MEDICATIONS: The patient's only medication is Adderall 20 mg a day.  ALLERGIES: He notes an allergy to Penicillin.  SYSTEMS REVIEW: Notable only for the items in the History of Present Illness.  PHYSICAL EXAM: He stands straight and erect.  Palpation and percussion across the lower lumbar spine do not reproduce any pain, and on sitting I note that he tends to favor about a 30-degree positive straight leg raise.  Patrick maneuver is negative.  His motor strength is good in the iliopsoas, quadriceps, tibialis anterior, and the gastrocs.  Deep tendon reflexes are absent in both Achilles and 2+ in the patellae.  His motor strength is good, as he has toe and heel walking intact.  IMPRESSION: The patient has evidence of a broadly degenerated disc at the L4-L5 level.  The patient does have a disc herniation; however, this is a rather broad disc herniation with advanced degenerative change in the disc space at  L4-5 also.  I noted to the patient that certainly one could consider a microdiscectomy to decompress the path of the nerve root; however, given the degree of degenerative change in the disc space, I do not believe that this is going to be a good or effective treatment for long-term relief.  Ultimately, I believe the patient would be best served with a decompression of the disc space, excision of the entirety of the  disc, and a stabilization procedure with an interbody spacer being placed at L4-5 to reconstruct the height of the interspace and stabilize the joint.  This is a somewhat more involved procedure, but, in an effort to prevent either a recurrence of the disc herniation, or be ongoing chronic back pain because of a degenerated disc, I believe that the arthrodesis gives him a better option.  I noted that typically the surgery is done through an anterior approach into the retroperitoneal space.  This is done with the help of a vascular surgeon to access the disc space.  The surgery is done with a simple overnight stay.  Generally, patients require the use of an external corset for a period of about 6 weeks, but ultimately, once they recover, their radiculopathy is better, and also their functional status with their back is better.  After some discussion, the patient would like to proceed, and we will see what arrangements we can make.  I do not believe that further efforts at conservative management are likely to yield any better results than what he has experienced to this time, and he notes that his functional status at this point is not acceptable to him as it is.

## 2019-02-09 NOTE — Op Note (Signed)
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  Can ask putty with cancellus allograft. Surgeon: Kristeen Miss Approach: Sherren Mocha early MD Anesthesia: General endotracheal Indications: Mr. Derrick Zhang is a 63 year old man who is had significant left lumbar radicular pain.  This is been chronic in nature and despite conservative efforts he notes that he is getting some weakness in his leg he is advised regarding surgical decompression via an anterior lumbar interbody arthrodesis.  Procedure: Patient was brought to the operating room supine on the stretcher.  After the smooth induction of general endotracheal anesthesia was placed on the operating table in the supine position and the abdomen was marked at the level of the L4-L5 disc space ventrally.  The skin was prepped with alcohol DuraPrep and draped in a sterile fashion.  Dr. early performed the approach to the retroperitoneal space at L4-L5.  And ultimately placed a series of Thompson retractors to expose this area.  I started my portion of the procedure by opening the anterior longitudinal ligament at L4-5 which was verified radiographically and removing the entirety of the disc at L4-L5 using combination of curettes and rongeurs.  The disc was noted to be severely degenerated.  The outer bands of the ligament appeared to be taut however along the posterior aspect of the vertebral body there was substantial subligamentous disc herniation.  Fragments of disc were retrieved from the hind the vertebral body of L5 particularly and particularly on the left side.  This corresponded with the disc herniation that was present on the  MRI.  The ligament itself was not opened however all the material in the subligamentous space was removed.  Then a series of spacers were placed and was felt that ultimately a 15 mm tall 8 degree lordotic spacer measuring 29 x 39 mm would fit best prior to placing this the endplates were shaved with a 4 mm barrel bit to remove bits of cartilaginous endplate that may have been attached.  When they surfaces were prepared the graft spacer was filled with a combination of connects and cancellus allograft.  This was then tamped into position under fluoroscopic guidance.  Was felt to be centered appropriately a singular 25 mm screw was placed into the vertebral body of L4 and to 25 mm screws were placed into the vertebral body of L5.  These were locked into position.  Final radiographs were obtained in the AP and lateral projection.  Then the retractors were gradually released making sure that there is no bleeding from any of the major vasculature.  With the retractor was removed the anterior rectus sheath was closed with 0 PDS suture in a running fashion 2-0 Vicryl was used in the subcutaneous tissues 3-0 Vicryl and the superficial subcuticular layer and 4-0 Vicryl as a final closure.  Dermabond was placed on the skin blood loss for the entire procedure was estimated at 100 cc.  Patient was returned to recovery room in stable condition.

## 2019-02-09 NOTE — Anesthesia Procedure Notes (Signed)
Procedure Name: Intubation Date/Time: 02/09/2019 7:54 AM Performed by: Jenne Campus, CRNA Pre-anesthesia Checklist: Patient identified, Emergency Drugs available, Suction available and Patient being monitored Patient Re-evaluated:Patient Re-evaluated prior to induction Oxygen Delivery Method: Circle System Utilized Preoxygenation: Pre-oxygenation with 100% oxygen Induction Type: IV induction Ventilation: Mask ventilation without difficulty Laryngoscope Size: Miller and 3 Grade View: Grade I Tube type: Oral Tube size: 8.0 mm Number of attempts: 1 Airway Equipment and Method: Stylet and Oral airway Placement Confirmation: ETT inserted through vocal cords under direct vision,  positive ETCO2 and breath sounds checked- equal and bilateral Secured at: 25 cm Tube secured with: Tape Dental Injury: Teeth and Oropharynx as per pre-operative assessment

## 2019-02-09 NOTE — Evaluation (Addendum)
Physical Therapy Evaluation and Discharge Patient Details Name: Derrick Zhang MRN: [id number] DOB: 1956-01-14 Today's Date: 02/09/2019   History of Present Illness  Pt is a 63 y/o male s/p L4-5 ALIF. Pt with no pertinent PMH.   Clinical Impression  Patient evaluated by Physical Therapy with no further acute PT needs identified. All education has been completed and the patient has no further questions. Pt overall at a supervision level for gait and stair navigation. No LOB noted. Educated about back precautions, generalized walking program, and how to maintain precautions during ADL tasks. See below for any follow-up Physical Therapy or equipment needs. PT is signing off. Thank you for this referral. If needs change, please re-consult.     Follow Up Recommendations No PT follow up    Equipment Recommendations  None recommended by PT    Recommendations for Other Services       Precautions / Restrictions Precautions Precautions: Back Precaution Booklet Issued: Yes (comment) Precaution Comments: Reviewed back precautions with pt.  Required Braces or Orthoses: Spinal Brace Spinal Brace: Lumbar corset;Applied in standing position Restrictions Weight Bearing Restrictions: No      Mobility  Bed Mobility Overal bed mobility: Needs Assistance Bed Mobility: Rolling;Sidelying to Sit Rolling: Supervision Sidelying to sit: Supervision       General bed mobility comments: Supervision to ensure log roll technique.   Transfers Overall transfer level: Needs assistance Equipment used: None Transfers: Sit to/from Stand Sit to Stand: Supervision         General transfer comment: Supervision for safety.   Ambulation/Gait Ambulation/Gait assistance: Supervision Gait Distance (Feet): 400 Feet Assistive device: None Gait Pattern/deviations: Step-through pattern;Decreased stride length Gait velocity: Decreased    General Gait Details: Guarded gait, however, overall steady. No LOB  noted. Educated about generalized walking program to perform at home.   Stairs Stairs: Yes Stairs assistance: Supervision Stair Management: One rail Right;Alternating pattern;Step to pattern;Forwards Number of Stairs: 10 General stair comments: Steady stair navigation using rail. Used alternating pattern to ascend and step to pattern to descend. No LOB noted.   Wheelchair Mobility    Modified Rankin (Stroke Patients Only)       Balance Overall balance assessment: No apparent balance deficits (not formally assessed)                                           Pertinent Vitals/Pain Pain Assessment: Faces Faces Pain Scale: Hurts a little bit Pain Location: back Pain Descriptors / Indicators: Aching;Operative site guarding Pain Intervention(s): Limited activity within patient's tolerance;Monitored during session;Repositioned    Home Living Family/patient expects to be discharged to:: Private residence Living Arrangements: Spouse/significant other;Children Available Help at Discharge: Family Type of Home: House Home Access: Stairs to enter Entrance Stairs-Rails: None Technical brewer of Steps: 3 Home Layout: Two level Home Equipment: None      Prior Function Level of Independence: Independent               Hand Dominance        Extremity/Trunk Assessment   Upper Extremity Assessment Upper Extremity Assessment: Defer to OT evaluation;Overall WFL for tasks assessed    Lower Extremity Assessment Lower Extremity Assessment: Overall WFL for tasks assessed;LLE deficits/detail LLE Deficits / Details: Reports LLE pain is better    Cervical / Trunk Assessment Cervical / Trunk Assessment: Other exceptions Cervical / Trunk Exceptions: s/p ALIF  Communication  Communication: No difficulties  Cognition Arousal/Alertness: Awake/alert Behavior During Therapy: WFL for tasks assessed/performed Overall Cognitive Status: Within Functional Limits  for tasks assessed                                        General Comments General comments (skin integrity, edema, etc.): Educated about how to maintain precautions during ADL and bathing tasks.     Exercises     Assessment/Plan    PT Assessment Patent does not need any further PT services  PT Problem List         PT Treatment Interventions      PT Goals (Current goals can be found in the Care Plan section)  Acute Rehab PT Goals Patient Stated Goal: to go home PT Goal Formulation: With patient Time For Goal Achievement: 02/09/19 Potential to Achieve Goals: Good    Frequency     Barriers to discharge        Co-evaluation               AM-PAC PT "6 Clicks" Mobility  Outcome Measure Help needed turning from your back to your side while in a flat bed without using bedrails?: None Help needed moving from lying on your back to sitting on the side of a flat bed without using bedrails?: None Help needed moving to and from a bed to a chair (including a wheelchair)?: None Help needed standing up from a chair using your arms (e.g., wheelchair or bedside chair)?: None Help needed to walk in hospital room?: None Help needed climbing 3-5 steps with a railing? : None 6 Click Score: 24    End of Session Equipment Utilized During Treatment: Back brace Activity Tolerance: Patient tolerated treatment well Patient left: in bed;with call bell/phone within reach(sitting EOB ) Nurse Communication: Mobility status PT Visit Diagnosis: Other abnormalities of gait and mobility (R26.89)    Time: 1645-1700 PT Time Calculation (min) (ACUTE ONLY): 15 min   Charges:   PT Evaluation $PT Eval Low Complexity: Derrick Zhang, PT, DPT  Acute Rehabilitation Services  Pager: [phone] Office: [phone]   Derrick Zhang 02/09/2019, 5:21 PM

## 2019-02-09 NOTE — Op Note (Signed)
    OPERATIVE REPORT  DATE OF SURGERY: 02/09/2019  PATIENT: Derrick Zhang, 63 y.o. male MRN: [id number]  DOB: September 05, 1955  PRE-OPERATIVE DIAGNOSIS: Degenerative disc disease  POST-OPERATIVE DIAGNOSIS:  Same  PROCEDURE: Single level anterior exposure for L4-5 disc fusion  SURGEON:  Curt Jews, M.D.  Co-surgeon for the exposure Dr. Kristeen Miss  ANESTHESIA: General  EBL: per anesthesia record  Total I/O In: 1100 [I.V.:1100] Out: 400 [Urine:300; Blood:100]  BLOOD ADMINISTERED: none  DRAINS: none  SPECIMEN: none  COUNTS CORRECT:  YES  PATIENT DISPOSITION:  PACU - hemodynamically stable  PROCEDURE DETAILS: The patient was taken to the operating placed supine position where the area of the abdomen was prepped and draped in usual sterile fashion.  Lateral C arm projection was used to identify the level of the L4-5 disc on the surface of the skin.  Incision was made from the midline to the left over this area.  The subcutaneous fat was opened with electrocautery in line with the skin incision.  The anterior rectus sheath was opened with cautery in line with the skin incision as well.  The rectus muscle was mobilized circumferentially.  The retroperitoneal space was entered bluntly below the level of the semilunar line.  Blunt dissection was continued to mobilize intraperitoneal contents to the right.  The posterior rectus sheath was opened laterally.  The left ureter was mobilized to the right as well.  The iliac vessels were mobilized to the right.  Patient had a large iliolumbar vein and this was ligated with 2-0 silk ties and divided.  Blunt dissection was continued to give adequate exposure of the L4-5 disc.  The Thompson retractor was brought onto the field and the retractor blade was positioned to the right of the L4-5 disc and the reverse lip 150 graft blade were positioned to the left of the L4-5 disc.  Malleable retractors were used for inferior and superior exposure.  Spinal needle  was placed in the L4-5 disc and C-arm was brought back onto the field to confirm this was the appropriate location.  The remainder procedure be dictated separate note by Dr. Domenic Moras, M.D., [facility] 02/09/2019 1:10 PM

## 2019-02-10 MED ORDER — METHOCARBAMOL 750 MG PO TABS: 750.0000 mg | ORAL_TABLET | Freq: Three times a day (TID) | ORAL | 1 refills | Status: DC | PRN

## 2019-02-10 MED ORDER — OXYCODONE-ACETAMINOPHEN 5-325 MG PO TABS: 1.0000 | ORAL_TABLET | ORAL | 0 refills | Status: DC | PRN

## 2019-02-10 NOTE — Plan of Care (Signed)
Patient alert and oriented, mae's well, voiding adequate amount of urine, swallowing without difficulty, no c/o pain at time of discharge. Patient discharged home with family. Script and discharged instructions given to patient. Patient and family stated understanding of instructions given. Patient has an appointment with Dr. Elsner  

## 2019-02-10 NOTE — Progress Notes (Signed)
Patient ID: Derrick Zhang, male   DOB: 08/31/1955, 63 y.o.   MRN: [id number] Comfortable.  Sitting up in chair.  Minimal soreness and has not required narcotic pain medication. No nausea or vomiting. Palpable dorsalis pedis bilaterally  Stable postop day 1 from L4-5 lumbar fusion with anterior exposure.  Probable discharge today per neurosurgery.  Will not follow actively.  Please call if we can assist

## 2019-02-10 NOTE — Progress Notes (Signed)
  NEUROSURGERY PROGRESS NOTE   No issues overnight.  No concerns this am Minimal soreness this am  EXAM:  BP 118/66 (BP Location: Right Arm)   Pulse 61   Temp 98.4 F (36.9 C) (Oral)   Resp 16   Ht 6\' 3"  (1.905 m)   Wt 84.8 kg   SpO2 100%   BMI 23.37 kg/m   Awake, alert, oriented  Speech fluent, appropriate  CN grossly intact  5/5 BUE/BLE  Incision c/d/i  IMPRESSION/PLAN 63 y.o. male s/p L4-5 ALIF. Doing well - D/C home

## 2019-02-10 NOTE — Progress Notes (Signed)
Occupational Therapy Evaluation Patient Details Name: Derrick Zhang MRN: [id number] DOB: Oct 11, 1955 Today's Date: 02/10/2019    History of Present Illness Pt is a 63 y/o male s/p L4-5 ALIF. Pt with no pertinent PMH.    Clinical Impression   PTA, pt lived in two story home with wife, worked in [place], and was independent with ADLs and IADLs. Pt currently presents with pain and decreased knowledge of precautions. Pt recalled 2/3 precautions; provided education and reviewed all precautions. Pt performed dressing and toilet transfer with supervision for safety and maintaining back precautions. Recommend dc home with no OT follow up once medically stable per MD. All education and acute needs met, will sign off.     Follow Up Recommendations  No OT follow up    Equipment Recommendations  None recommended by OT    Recommendations for Other Services PT consult     Precautions / Restrictions Precautions Precautions: Back Precaution Booklet Issued: Yes (comment) Precaution Comments: Recalled 2/3 precautions. Reviewed back precautions with pt.  Required Braces or Orthoses: Spinal Brace Spinal Brace: Lumbar corset;Applied in sitting position Restrictions Weight Bearing Restrictions: No      Mobility Bed Mobility Overal bed mobility: Modified Independent             General bed mobility comments: Pt performed log rolling technique with mod I for increased time  Transfers Overall transfer level: Needs assistance Equipment used: None Transfers: Sit to/from Stand Sit to Stand: Supervision         General transfer comment: required supervision for safety    Balance Overall balance assessment: No apparent balance deficits (not formally assessed)                                         ADL either performed or assessed with clinical judgement   ADL Overall ADL's : Needs assistance/impaired Eating/Feeding: Independent   Grooming:  Supervision/safety;Standing   Upper Body Bathing: Supervision/ safety;Sitting   Lower Body Bathing: Supervison/ safety;Sit to/from stand   Upper Body Dressing : Supervision/safety;Sitting;Standing Upper Body Dressing Details (indicate cue type and reason): Pt donned shirt and brace with supervision for safety Lower Body Dressing: Supervision/safety;Sit to/from stand Lower Body Dressing Details (indicate cue type and reason): Pt used figure 4 method for LB ADLs. required supervision for safety and maintaining back precautions Toilet Transfer: Supervision/safety;Ambulation;Regular Glass blower/designer Details (indicate cue type and reason): Pt required supervision for safety Toileting- Clothing Manipulation and Hygiene: Supervision/safety;Sit to/from stand       Functional mobility during ADLs: Supervision/safety General ADL Comments: Pt preformed ADLs with supervision for safety and maintaining back precautions     Vision Baseline Vision/History: Wears glasses Wears Glasses: Reading only Patient Visual Report: No change from baseline       Perception     Praxis      Pertinent Vitals/Pain Pain Assessment: No/denies pain     Hand Dominance Right   Extremity/Trunk Assessment Upper Extremity Assessment Upper Extremity Assessment: Overall WFL for tasks assessed   Lower Extremity Assessment Lower Extremity Assessment: Defer to PT evaluation   Cervical / Trunk Assessment Cervical / Trunk Assessment: Other exceptions Cervical / Trunk Exceptions: s/p ALIF   Communication Communication Communication: No difficulties   Cognition Arousal/Alertness: Awake/alert Behavior During Therapy: WFL for tasks assessed/performed Overall Cognitive Status: Within Functional Limits for tasks assessed  General Comments       Exercises     Shoulder Instructions      Home Living Family/patient expects to be discharged to:: Private  residence Living Arrangements: Spouse/significant other;Children Available Help at Discharge: Family Type of Home: House Home Access: Stairs to enter Technical brewer of Steps: 5 Entrance Stairs-Rails: None Home Layout: Two level Alternate Level Stairs-Number of Steps: flight Alternate Level Stairs-Rails: Right Bathroom Shower/Tub: Occupational psychologist: Standard     Home Equipment: None          Prior Functioning/Environment Level of Independence: Independent        Comments: Pt works in [place] List: Pain;Decreased activity tolerance;Decreased range of motion      OT Treatment/Interventions:      OT Goals(Current goals can be found in the care plan section) Acute Rehab OT Goals Patient Stated Goal: to go home OT Goal Formulation: All assessment and education complete, DC therapy  OT Frequency:     Barriers to D/C:            Co-evaluation              AM-PAC OT "6 Clicks" Daily Activity     Outcome Measure Help from another person eating meals?: None Help from another person taking care of personal grooming?: None Help from another person toileting, which includes using toliet, bedpan, or urinal?: None Help from another person bathing (including washing, rinsing, drying)?: None Help from another person to put on and taking off regular upper body clothing?: None Help from another person to put on and taking off regular lower body clothing?: None 6 Click Score: 24   End of Session Equipment Utilized During Treatment: Back brace Nurse Communication: Mobility status  Activity Tolerance: Patient tolerated treatment well Patient left: in chair  OT Visit Diagnosis: Pain Pain - part of body: (back)                Time: 3212-2482 OT Time Calculation (min): 16 min Charges:  OT General Charges $OT Visit: 1 Visit OT Evaluation $OT Eval Low Complexity: 1 Low  Gus Rankin, OT Student  Di Kindle Safiya Girdler 02/10/2019,  10:20 AM

## 2019-02-10 NOTE — Discharge Summary (Signed)
Physician Discharge Summary  Patient ID: Derrick Zhang MRN: [id number] DOB/AGE: 01-24-1956 63 y.o.  Admit date: 02/09/2019 Discharge date: 02/10/2019  Admission Diagnoses:  L4-5 HNP  Discharge Diagnoses:  Same Active Problems:   Herniated nucleus pulposus, L4-5 left  Discharged Condition: Stable  Hospital Course:  Derrick Zhang is a 63 y.o. male who was admitted for the below procedure. There were no post operative complications. At time of discharge, pain was well controlled, ambulating with Pt/OT, tolerating po, voiding normal. Ready for discharge.  Treatments: Surgery Anterior lumbar decompression L4-L5 with anterior lumbar interbody arthrodesis using Hedron titanium spacer 29 x 39 x 15 mm with 8 degrees lordosis and coated variable angle bone screws 5.5 x 25 mm.  Can ask putty with cancellus allograft.  Discharge Exam: Blood pressure 118/66, pulse 61, temperature 98.4 F (36.9 C), temperature source Oral, resp. rate 16, height 6\' 3"  (1.905 m), weight 84.8 kg, SpO2 100 %. Awake, alert, oriented Speech fluent, appropriate CN grossly intact 5/5 BUE/BLE Wound c/d/i  Disposition: Discharge disposition: 01-Home or Self Care       Discharge Instructions    Call MD for:  difficulty breathing, headache or visual disturbances   Complete by: As directed    Call MD for:  persistant dizziness or light-headedness   Complete by: As directed    Call MD for:  redness, tenderness, or signs of infection (pain, swelling, redness, odor or green/yellow discharge around incision site)   Complete by: As directed    Call MD for:  severe uncontrolled pain   Complete by: As directed    Call MD for:  temperature >100.4   Complete by: As directed    Diet general   Complete by: As directed    Driving Restrictions   Complete by: As directed    Do not drive until given clearance.   Incentive spirometry RT   Complete by: As directed    Increase activity slowly   Complete by: As directed    Lifting restrictions   Complete by: As directed    Do not lift anything >10lbs. Avoid bending and twisting in awkward positions. Avoid bending at the back.   May shower / Bathe   Complete by: As directed    In 24 hours. Okay to wash wound with warm soapy water. Avoid scrubbing the wound. Pat dry.   Remove dressing in 24 hours   Complete by: As directed      Allergies as of 02/10/2019      Reactions   Penicillins    Did it involve swelling of the face/tongue/throat, SOB, or low BP? Unknown Did it involve sudden or severe rash/hives, skin peeling, or any reaction on the inside of your mouth or nose? Unknown Did you need to seek medical attention at a hospital or doctor's office? Unknown When did it last happen?Was told as a child but does not recall the experience If all above answers are "NO", may proceed with cephalosporin use.      Medication List    TAKE these medications   acetaminophen 500 MG tablet Commonly known as: TYLENOL Take 500 mg by mouth every 6 (six) hours as needed.   amphetamine-dextroamphetamine 20 MG tablet Commonly known as: Adderall Take 1 tablet (20 mg total) by mouth daily.   methocarbamol 750 MG tablet Commonly known as: Robaxin-750 Take 1 tablet (750 mg total) by mouth 3 (three) times daily as needed for muscle spasms.   oxyCODONE-acetaminophen 5-325 MG tablet Commonly known as: Percocet Take  1 tablet by mouth every 4 (four) hours as needed for severe pain.      Follow-up Information    Kristeen Miss, MD Follow up.   Specialty: Neurosurgery Contact information: [address] [place] [address] [phone]           Signed: Traci Sermon 02/10/2019, 7:36 AM

## 2019-02-10 NOTE — Progress Notes (Signed)
Pt A/Ox4, no complaints voiced.  Was seen and visited by [facility], PA-C earlier this morning.  Pt was given discharge instructions; hard copy of pt's prescription was also given to pt.

## 2019-02-10 NOTE — Discharge Instructions (Signed)

## 2019-02-12 ENCOUNTER — Encounter (HOSPITAL_COMMUNITY): Payer: Self-pay | Admitting: Neurological Surgery

## 2019-09-25 ENCOUNTER — Other Ambulatory Visit: Payer: Self-pay

## 2019-09-25 ENCOUNTER — Encounter: Payer: Self-pay | Admitting: Sports Medicine

## 2019-09-25 ENCOUNTER — Ambulatory Visit: Payer: Medicare Other | Admitting: Sports Medicine

## 2019-09-25 VITALS — BP 128/84 | HR 82 | Temp 98.0°F | Resp 16

## 2019-09-25 DIAGNOSIS — M79674 Pain in right toe(s): Secondary | ICD-10-CM | POA: Diagnosis not present

## 2019-09-25 DIAGNOSIS — L600 Ingrowing nail: Secondary | ICD-10-CM | POA: Diagnosis not present

## 2019-09-25 MED ORDER — NEOMYCIN-POLYMYXIN-HC 3.5-10000-1 OT SOLN: OTIC | 0 refills | Status: AC

## 2019-09-25 NOTE — Progress Notes (Signed)
Subjective: Larney Seachrist is a 64 y.o. male patient presents to office today complaining of a mildly painful incurvated, mildly red, medial nail border of the 1st toe on the right foot. This has been present for 1 month. Patient has treated this by doing nothing. Patient denies fever/chills/nausea/vomitting/any other related constitutional symptoms at this time.  Review of Systems  All other systems reviewed and are negative.   Patient Active Problem List   Diagnosis Date Noted  . Herniated nucleus pulposus, L4-5 left 02/09/2019    Current Outpatient Medications on File Prior to Visit  Medication Sig Dispense Refill  . acetaminophen (TYLENOL) 500 MG tablet Take 500 mg by mouth every 6 (six) hours as needed.    Marland Kitchen amphetamine-dextroamphetamine (ADDERALL) 20 MG tablet Take 1 tablet (20 mg total) by mouth daily. 30 tablet 0   No current facility-administered medications on file prior to visit.    Allergies  Allergen Reactions  . Penicillins      Did it involve swelling of the face/tongue/throat, SOB, or low BP? Unknown Did it involve sudden or severe rash/hives, skin peeling, or any reaction on the inside of your mouth or nose? Unknown Did you need to seek medical attention at a hospital or doctor's office? Unknown When did it last happen?Was told as a child but does not recall the experience If all above answers are "NO", may proceed with cephalosporin use.     Objective:  There were no vitals filed for this visit.  General: Well developed, nourished, in no acute distress, alert and oriented x3   Dermatology: Skin is warm, dry and supple bilateral. Right hallux nail appears to be mildly incurvated at the medial border. (-) Erythema. (+) Edema. (-) serosanguous drainage present. The remaining nails appear unremarkable at this time. There are no open sores, lesions or other signs of infection  present.  Vascular: Dorsalis Pedis artery and Posterior Tibial artery pedal pulses  are 1/4 bilateral with immedate capillary fill time. Pedal hair growth present. No lower extremity edema.   Neruologic: Grossly intact via light touch bilateral.  Musculoskeletal: Minimal tenderness to palpation of the Right hallux medial nail fold. Muscular strength within normal limits in all groups bilateral.   Assesement and Plan: Problem List Items Addressed This Visit    None    Visit Diagnoses    Ingrown nail    -  Primary   Toe pain, right          -Discussed treatment alternatives and plan of care; Explained permanent/temporary nail avulsion and post procedure course to patient. Patient declines nail avulsion procedure at this time -Mechnically debrided right hallux medial margin in a slant back fashion using sterile nail nipper and applied antibiotic cream and a dry sterile dressing. -Patient was instructed to leave the bandaid intact for today and begin soaking in a weak solution of betadine or Epsom salt and water tomorrow. Patient was instructed to soak for 15-20 minutes each day and apply neosporin/corticosporin and a gauze or bandaid dressing each day for 1 week. -Patient was instructed to monitor the toe for signs of infection and return to office if toe becomes red, hot or swollen. -Advised ice, elevation, and tylenol or motrin if needed for pain.  -Patient is to return when ready for avulsion nail procedure if the slant back performed today fails to provide relief or sooner if problems arise.  Landis Martins, DPM

## 2019-09-25 NOTE — Patient Instructions (Addendum)
Soak Instructions    THE DAY AFTER THE PROCEDURE  Place 1/4 cup of epsom salts in a quart of warm tap water, continue to soak in the solution for 20 minutes.  This soak should be done once a day.  Apply corticosporin daily for 1 week

## 2020-06-11 DIAGNOSIS — Z79899 Other long term (current) drug therapy: Secondary | ICD-10-CM | POA: Diagnosis not present

## 2020-06-11 DIAGNOSIS — R972 Elevated prostate specific antigen [PSA]: Secondary | ICD-10-CM | POA: Diagnosis not present

## 2020-06-11 DIAGNOSIS — Z7689 Persons encountering health services in other specified circumstances: Secondary | ICD-10-CM | POA: Diagnosis not present

## 2020-06-11 DIAGNOSIS — Z125 Encounter for screening for malignant neoplasm of prostate: Secondary | ICD-10-CM | POA: Diagnosis not present

## 2020-06-11 DIAGNOSIS — R739 Hyperglycemia, unspecified: Secondary | ICD-10-CM | POA: Diagnosis not present

## 2020-06-24 DIAGNOSIS — M5116 Intervertebral disc disorders with radiculopathy, lumbar region: Secondary | ICD-10-CM | POA: Diagnosis not present

## 2020-06-24 DIAGNOSIS — Z1212 Encounter for screening for malignant neoplasm of rectum: Secondary | ICD-10-CM | POA: Diagnosis not present

## 2020-06-24 DIAGNOSIS — Z0000 Encounter for general adult medical examination without abnormal findings: Secondary | ICD-10-CM | POA: Diagnosis not present

## 2020-06-24 DIAGNOSIS — R82998 Other abnormal findings in urine: Secondary | ICD-10-CM | POA: Diagnosis not present

## 2020-06-24 DIAGNOSIS — Z79899 Other long term (current) drug therapy: Secondary | ICD-10-CM | POA: Diagnosis not present

## 2020-06-24 DIAGNOSIS — R972 Elevated prostate specific antigen [PSA]: Secondary | ICD-10-CM | POA: Diagnosis not present

## 2020-06-24 DIAGNOSIS — R69 Illness, unspecified: Secondary | ICD-10-CM | POA: Diagnosis not present

## 2020-07-09 DIAGNOSIS — R82998 Other abnormal findings in urine: Secondary | ICD-10-CM | POA: Diagnosis not present

## 2020-10-27 DIAGNOSIS — H04123 Dry eye syndrome of bilateral lacrimal glands: Secondary | ICD-10-CM | POA: Diagnosis not present

## 2020-10-27 DIAGNOSIS — H2513 Age-related nuclear cataract, bilateral: Secondary | ICD-10-CM | POA: Diagnosis not present

## 2021-03-22 IMAGING — RF DG LUMBAR SPINE 2-3V
1 series · 2 of 2 positions shown · non-contrast
Comparison: Lumbar spine MRI-06/13/2018

CLINICAL DATA: ALIF L4-L5

EXAM:
LUMBAR SPINE - 2-3 VIEW; DG C-ARM 1-60 MIN

[Series 1: run · 2 of 2 slices shown]
[im 1/2]
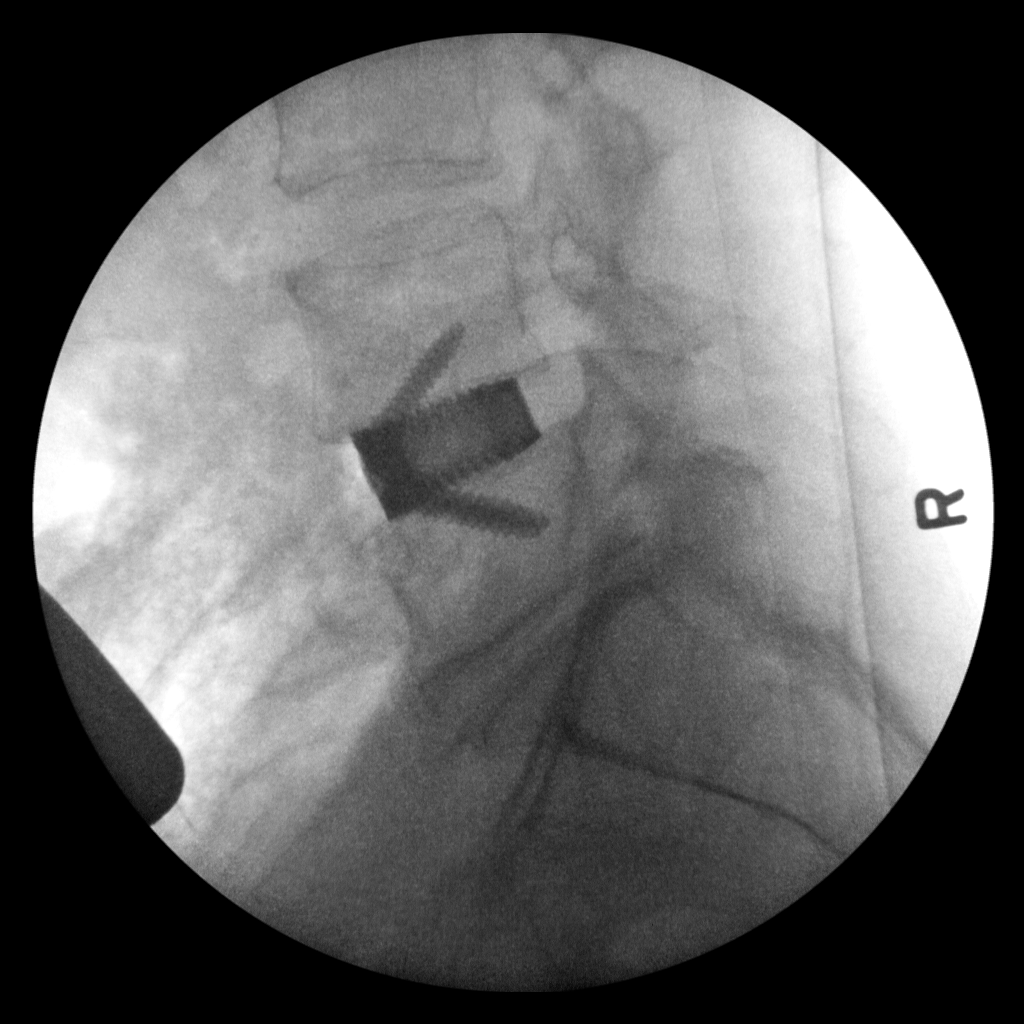
[im 2/2]
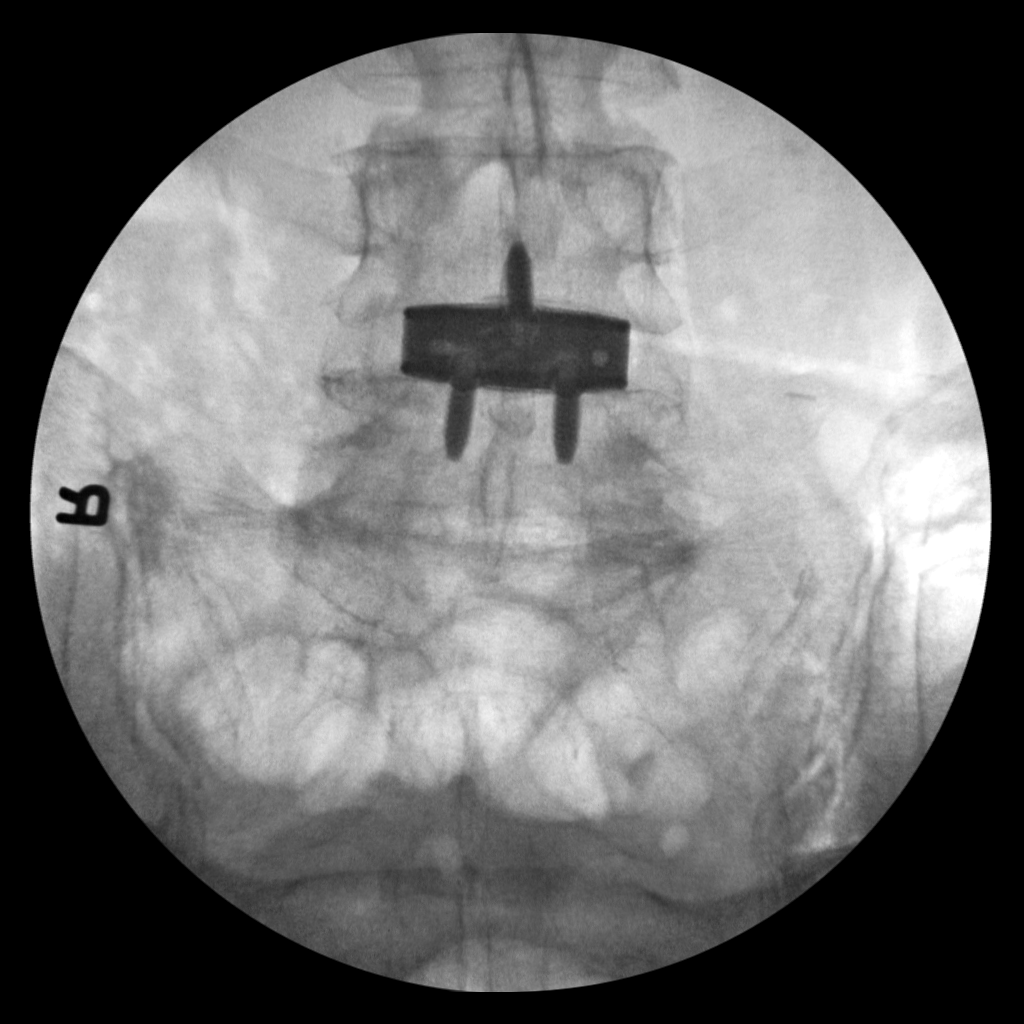

[2 of 2 positions shown; findings below may reference images not displayed]

FINDINGS: Two spot fluoroscopic images of the lower lumbar spine are provided
for review.

Lumbar spine labeling is in keeping with preprocedural lumbar spine
MRI

Images demonstrate the sequela L4-L5 intervertebral disc space
replacement. Alignment appears anatomic with restoration of the
L4-L5 intervertebral disc space height.

Regional soft tissues appear normal.
IMPRESSION: Post L4-L5 intervertebral disc space replacement without evidence of
complication.

## 2021-03-22 IMAGING — RF DG C-ARM 1-60 MIN
1 series · 2 of 2 positions shown · non-contrast
Comparison: Lumbar spine MRI-06/13/2018

CLINICAL DATA: ALIF L4-L5

EXAM:
LUMBAR SPINE - 2-3 VIEW; DG C-ARM 1-60 MIN

[Series 1: run · 2 of 2 slices shown]
[im 1/2]
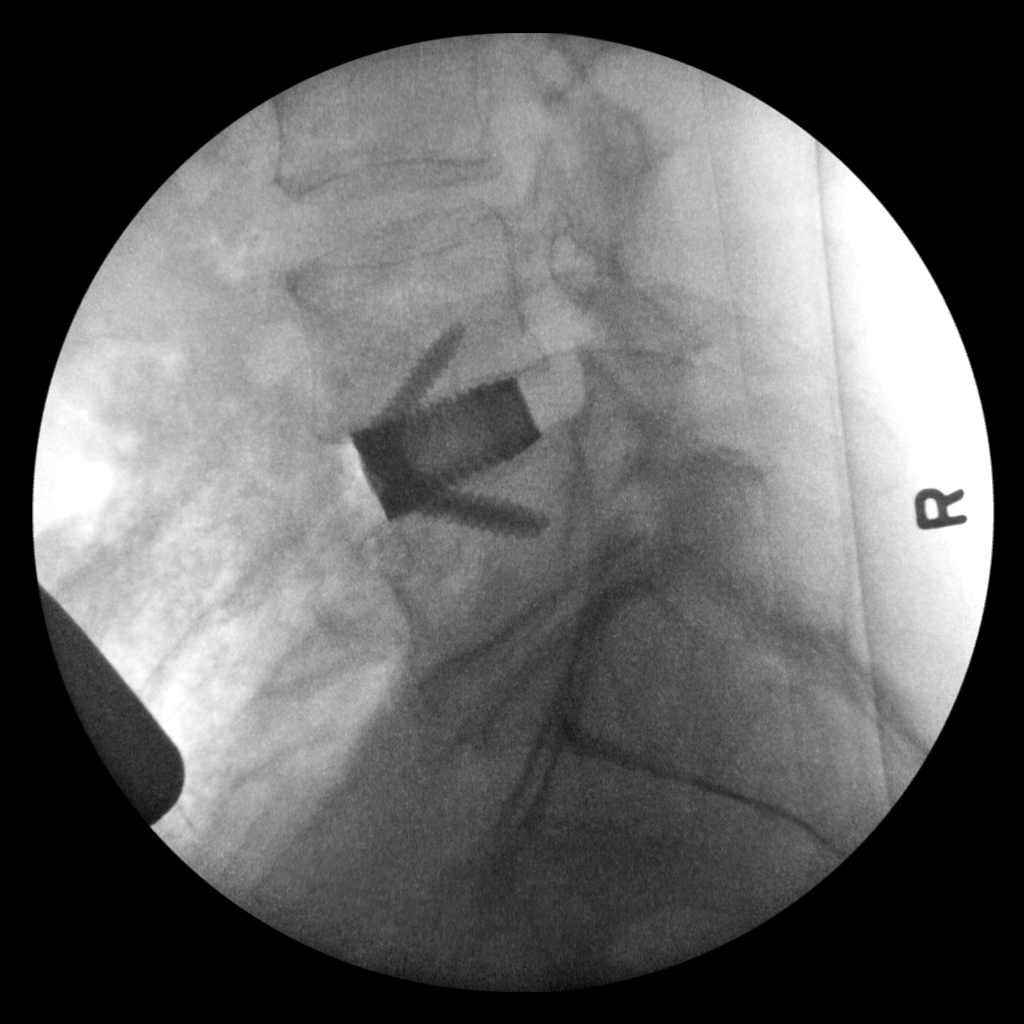
[im 2/2]
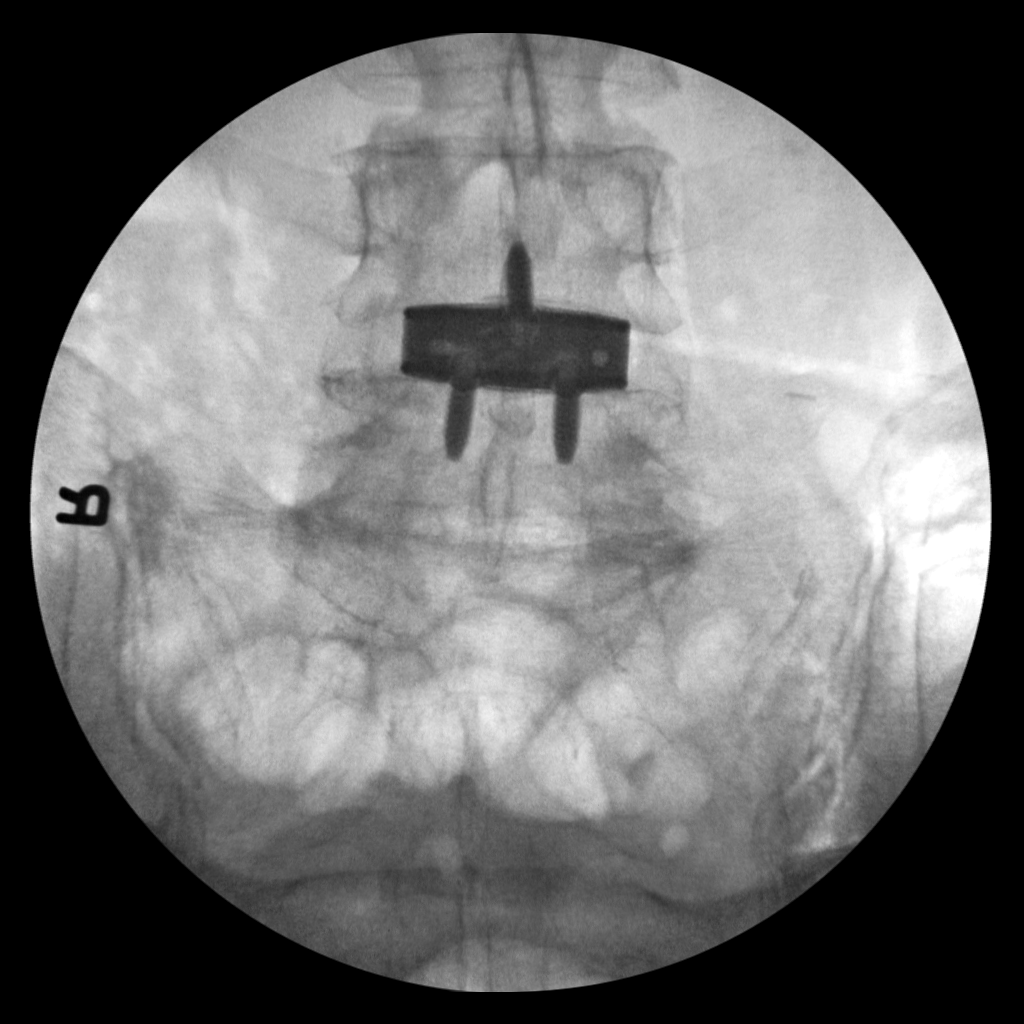

[2 of 2 positions shown; findings below may reference images not displayed]

FINDINGS: Two spot fluoroscopic images of the lower lumbar spine are provided
for review.

Lumbar spine labeling is in keeping with preprocedural lumbar spine
MRI

Images demonstrate the sequela L4-L5 intervertebral disc space
replacement. Alignment appears anatomic with restoration of the
L4-L5 intervertebral disc space height.

Regional soft tissues appear normal.
IMPRESSION: Post L4-L5 intervertebral disc space replacement without evidence of
complication.

## 2021-07-01 DIAGNOSIS — Z125 Encounter for screening for malignant neoplasm of prostate: Secondary | ICD-10-CM | POA: Diagnosis not present

## 2021-07-01 DIAGNOSIS — R7989 Other specified abnormal findings of blood chemistry: Secondary | ICD-10-CM | POA: Diagnosis not present

## 2021-07-01 DIAGNOSIS — E038 Other specified hypothyroidism: Secondary | ICD-10-CM | POA: Diagnosis not present

## 2021-07-01 DIAGNOSIS — R739 Hyperglycemia, unspecified: Secondary | ICD-10-CM | POA: Diagnosis not present

## 2021-07-31 DIAGNOSIS — R69 Illness, unspecified: Secondary | ICD-10-CM | POA: Diagnosis not present

## 2021-07-31 DIAGNOSIS — E038 Other specified hypothyroidism: Secondary | ICD-10-CM | POA: Diagnosis not present

## 2021-07-31 DIAGNOSIS — Z0000 Encounter for general adult medical examination without abnormal findings: Secondary | ICD-10-CM | POA: Diagnosis not present

## 2021-07-31 DIAGNOSIS — M5116 Intervertebral disc disorders with radiculopathy, lumbar region: Secondary | ICD-10-CM | POA: Diagnosis not present

## 2021-07-31 DIAGNOSIS — F909 Attention-deficit hyperactivity disorder, unspecified type: Secondary | ICD-10-CM | POA: Diagnosis not present

## 2021-07-31 DIAGNOSIS — R972 Elevated prostate specific antigen [PSA]: Secondary | ICD-10-CM | POA: Diagnosis not present

## 2021-08-19 DIAGNOSIS — Z1211 Encounter for screening for malignant neoplasm of colon: Secondary | ICD-10-CM | POA: Diagnosis not present

## 2022-04-29 DIAGNOSIS — K5730 Diverticulosis of large intestine without perforation or abscess without bleeding: Secondary | ICD-10-CM | POA: Diagnosis not present

## 2022-04-29 DIAGNOSIS — K635 Polyp of colon: Secondary | ICD-10-CM | POA: Diagnosis not present

## 2022-04-29 DIAGNOSIS — Z1211 Encounter for screening for malignant neoplasm of colon: Secondary | ICD-10-CM | POA: Diagnosis not present

## 2022-04-29 DIAGNOSIS — D122 Benign neoplasm of ascending colon: Secondary | ICD-10-CM | POA: Diagnosis not present

## 2022-06-17 ENCOUNTER — Encounter: Payer: Self-pay | Admitting: Podiatry

## 2022-06-17 ENCOUNTER — Ambulatory Visit: Payer: Medicare HMO | Admitting: Podiatry

## 2022-06-17 ENCOUNTER — Ambulatory Visit (INDEPENDENT_AMBULATORY_CARE_PROVIDER_SITE_OTHER): Payer: Medicare HMO

## 2022-06-17 DIAGNOSIS — M779 Enthesopathy, unspecified: Secondary | ICD-10-CM

## 2022-06-17 DIAGNOSIS — L600 Ingrowing nail: Secondary | ICD-10-CM | POA: Diagnosis not present

## 2022-06-17 DIAGNOSIS — M79671 Pain in right foot: Secondary | ICD-10-CM

## 2022-06-17 NOTE — Progress Notes (Signed)
Subjective:   Patient ID: Derrick Zhang, male   DOB: 67 y.o.   MRN: [id number]   HPI Patient presents with 2 problems with 1 being a chronic ingrown toenail of the right big toe that gets sore and secondarily pain on top of the foot which has been present for a shorter period of time and seems worse at nighttime.  Hard to describe the pain exactly and comes and goes.  Patient does not smoke likes to be active   ROS      Objective:  Physical Exam  Neurovascular status intact muscle strength found to be adequate range of motion adequate with incurvated medial border right big toe sore when pressed making shoe gear difficult.  Patient is found to have good digital perfusion well oriented x 3 with inflammation pain on the top of the right foot very sore when pressed     Assessment:  Ingrown toenail deformity right hallux medial border with pain along with tendinitis dorsal right inflamed moderately     Plan:  H&P reviewed both conditions.  For the dorsal tendon inflammation right I have recommended ice therapy and topical medicines with consideration for more aggressive treatment and discussed wearing good supportive shoe gear.  Ingrown toenail recommended correction I allowed him to read consent form and explained this to him and patient wants procedure.  Signed consent form I infiltrated the right hallux 60 mg like Marcaine mixture sterile prep and using sterile instrumentation removed the medial border exposed matrix applied phenol 3 applications 30 seconds followed by alcohol by sterile dressing gave instructions on soaks and reappoint for [place] to recheck again leave dressing on 24 hours take it off earlier if throbbing were to occur  X-rays were negative for signs of fracture or bony condition associated with pain

## 2022-06-17 NOTE — Patient Instructions (Signed)

## 2022-06-23 ENCOUNTER — Ambulatory Visit: Payer: Medicare HMO | Admitting: Podiatry

## 2022-08-02 DIAGNOSIS — Z125 Encounter for screening for malignant neoplasm of prostate: Secondary | ICD-10-CM | POA: Diagnosis not present

## 2022-08-02 DIAGNOSIS — Z789 Other specified health status: Secondary | ICD-10-CM | POA: Diagnosis not present

## 2022-08-02 DIAGNOSIS — E038 Other specified hypothyroidism: Secondary | ICD-10-CM | POA: Diagnosis not present

## 2022-08-09 DIAGNOSIS — D126 Benign neoplasm of colon, unspecified: Secondary | ICD-10-CM | POA: Diagnosis not present

## 2022-08-09 DIAGNOSIS — M5116 Intervertebral disc disorders with radiculopathy, lumbar region: Secondary | ICD-10-CM | POA: Diagnosis not present

## 2022-08-09 DIAGNOSIS — Z1339 Encounter for screening examination for other mental health and behavioral disorders: Secondary | ICD-10-CM | POA: Diagnosis not present

## 2022-08-09 DIAGNOSIS — E038 Other specified hypothyroidism: Secondary | ICD-10-CM | POA: Diagnosis not present

## 2022-08-09 DIAGNOSIS — R972 Elevated prostate specific antigen [PSA]: Secondary | ICD-10-CM | POA: Diagnosis not present

## 2022-08-09 DIAGNOSIS — Z1331 Encounter for screening for depression: Secondary | ICD-10-CM | POA: Diagnosis not present

## 2022-08-09 DIAGNOSIS — Z789 Other specified health status: Secondary | ICD-10-CM | POA: Diagnosis not present

## 2022-08-09 DIAGNOSIS — Z0000 Encounter for general adult medical examination without abnormal findings: Secondary | ICD-10-CM | POA: Diagnosis not present

## 2022-08-09 DIAGNOSIS — F909 Attention-deficit hyperactivity disorder, unspecified type: Secondary | ICD-10-CM | POA: Diagnosis not present

## 2022-08-10 ENCOUNTER — Other Ambulatory Visit: Payer: Self-pay | Admitting: Internal Medicine

## 2022-08-10 DIAGNOSIS — Z789 Other specified health status: Secondary | ICD-10-CM

## 2022-09-03 DIAGNOSIS — M25562 Pain in left knee: Secondary | ICD-10-CM | POA: Diagnosis not present

## 2022-09-17 ENCOUNTER — Inpatient Hospital Stay: Admission: RE | Admit: 2022-09-17 | Payer: Medicare HMO | Source: Ambulatory Visit

## 2023-01-12 DIAGNOSIS — H2513 Age-related nuclear cataract, bilateral: Secondary | ICD-10-CM | POA: Diagnosis not present

## 2023-01-12 DIAGNOSIS — H04123 Dry eye syndrome of bilateral lacrimal glands: Secondary | ICD-10-CM | POA: Diagnosis not present

## 2023-05-26 ENCOUNTER — Ambulatory Visit (INDEPENDENT_AMBULATORY_CARE_PROVIDER_SITE_OTHER): Payer: No Typology Code available for payment source

## 2023-08-10 DIAGNOSIS — E039 Hypothyroidism, unspecified: Secondary | ICD-10-CM | POA: Diagnosis not present

## 2023-08-10 DIAGNOSIS — R972 Elevated prostate specific antigen [PSA]: Secondary | ICD-10-CM | POA: Diagnosis not present

## 2023-08-10 DIAGNOSIS — R739 Hyperglycemia, unspecified: Secondary | ICD-10-CM | POA: Diagnosis not present

## 2023-08-10 DIAGNOSIS — E038 Other specified hypothyroidism: Secondary | ICD-10-CM | POA: Diagnosis not present

## 2023-08-15 DIAGNOSIS — E038 Other specified hypothyroidism: Secondary | ICD-10-CM | POA: Diagnosis not present

## 2023-08-15 DIAGNOSIS — Z0000 Encounter for general adult medical examination without abnormal findings: Secondary | ICD-10-CM | POA: Diagnosis not present

## 2023-08-15 DIAGNOSIS — Z1331 Encounter for screening for depression: Secondary | ICD-10-CM | POA: Diagnosis not present

## 2023-08-15 DIAGNOSIS — Z1339 Encounter for screening examination for other mental health and behavioral disorders: Secondary | ICD-10-CM | POA: Diagnosis not present

## 2023-09-02 DIAGNOSIS — R918 Other nonspecific abnormal finding of lung field: Secondary | ICD-10-CM | POA: Diagnosis not present

## 2023-12-13 DIAGNOSIS — M5116 Intervertebral disc disorders with radiculopathy, lumbar region: Secondary | ICD-10-CM | POA: Diagnosis not present

## 2023-12-13 DIAGNOSIS — M25551 Pain in right hip: Secondary | ICD-10-CM | POA: Diagnosis not present

## 2024-01-19 DIAGNOSIS — H2513 Age-related nuclear cataract, bilateral: Secondary | ICD-10-CM | POA: Diagnosis not present

## 2024-01-19 DIAGNOSIS — H04123 Dry eye syndrome of bilateral lacrimal glands: Secondary | ICD-10-CM | POA: Diagnosis not present
